# Patient Record
Sex: Female | Born: 1970
Health system: Southern US, Community
[De-identification: ages and names within clinical notes are randomized; demographics above are authoritative.]

## PROBLEM LIST (undated history)

## (undated) DIAGNOSIS — M069 Rheumatoid arthritis, unspecified: Secondary | ICD-10-CM

## (undated) DIAGNOSIS — R232 Flushing: Secondary | ICD-10-CM

## (undated) DIAGNOSIS — R102 Pelvic and perineal pain: Principal | ICD-10-CM

## (undated) DIAGNOSIS — E669 Obesity, unspecified: Secondary | ICD-10-CM

## (undated) DIAGNOSIS — M1990 Unspecified osteoarthritis, unspecified site: Secondary | ICD-10-CM

## (undated) DIAGNOSIS — I10 Essential (primary) hypertension: Secondary | ICD-10-CM

## (undated) DIAGNOSIS — F32A Depression, unspecified: Secondary | ICD-10-CM

## (undated) DIAGNOSIS — N926 Irregular menstruation, unspecified: Secondary | ICD-10-CM

## (undated) DIAGNOSIS — I2510 Atherosclerotic heart disease of native coronary artery without angina pectoris: Secondary | ICD-10-CM

## (undated) DIAGNOSIS — D219 Benign neoplasm of connective and other soft tissue, unspecified: Secondary | ICD-10-CM

## (undated) HISTORY — DX: Pelvic and perineal pain: R10.2

## (undated) HISTORY — DX: Depression, unspecified: F32.A

## (undated) HISTORY — DX: Rheumatoid arthritis, unspecified: M06.9

## (undated) HISTORY — DX: Flushing: R23.2

## (undated) HISTORY — DX: Benign neoplasm of connective and other soft tissue, unspecified: D21.9

## (undated) HISTORY — DX: Unspecified osteoarthritis, unspecified site: M19.90

## (undated) HISTORY — DX: Irregular menstruation, unspecified: N92.6

## (undated) HISTORY — DX: Atherosclerotic heart disease of native coronary artery without angina pectoris: I25.10

## (undated) HISTORY — DX: Essential (primary) hypertension: I10

## (undated) HISTORY — PX: CHOLECYSTECTOMY: SHX55

## (undated) HISTORY — DX: Obesity, unspecified: E66.9

## (undated) HISTORY — DX: Morbid (severe) obesity due to excess calories: E66.01

## (undated) HISTORY — PX: KNEE ARTHROSCOPY: SUR90

---

## 2000-08-07 ENCOUNTER — Emergency Department (HOSPITAL_COMMUNITY): Admission: EM | Admit: 2000-08-07 | Discharge: 2000-08-07 | Payer: Self-pay | Admitting: Internal Medicine

## 2000-08-07 ENCOUNTER — Encounter: Payer: Self-pay | Admitting: Internal Medicine

## 2001-04-10 ENCOUNTER — Encounter: Payer: Self-pay | Admitting: *Deleted

## 2001-04-10 ENCOUNTER — Emergency Department (HOSPITAL_COMMUNITY): Admission: EM | Admit: 2001-04-10 | Discharge: 2001-04-10 | Payer: Self-pay | Admitting: *Deleted

## 2001-05-15 ENCOUNTER — Encounter: Payer: Self-pay | Admitting: *Deleted

## 2001-05-15 ENCOUNTER — Emergency Department (HOSPITAL_COMMUNITY): Admission: EM | Admit: 2001-05-15 | Discharge: 2001-05-15 | Payer: Self-pay | Admitting: *Deleted

## 2001-07-24 ENCOUNTER — Ambulatory Visit (HOSPITAL_COMMUNITY): Admission: RE | Admit: 2001-07-24 | Discharge: 2001-07-24 | Payer: Self-pay | Admitting: Pulmonary Disease

## 2002-06-22 ENCOUNTER — Emergency Department (HOSPITAL_COMMUNITY): Admission: EM | Admit: 2002-06-22 | Discharge: 2002-06-22 | Payer: Self-pay | Admitting: *Deleted

## 2002-06-27 ENCOUNTER — Ambulatory Visit (HOSPITAL_COMMUNITY): Admission: RE | Admit: 2002-06-27 | Discharge: 2002-06-27 | Payer: Self-pay | Admitting: Pulmonary Disease

## 2002-06-30 ENCOUNTER — Encounter: Payer: Self-pay | Admitting: *Deleted

## 2002-06-30 ENCOUNTER — Emergency Department (HOSPITAL_COMMUNITY): Admission: EM | Admit: 2002-06-30 | Discharge: 2002-06-30 | Payer: Self-pay | Admitting: *Deleted

## 2003-01-16 ENCOUNTER — Ambulatory Visit (HOSPITAL_COMMUNITY): Admission: RE | Admit: 2003-01-16 | Discharge: 2003-01-16 | Payer: Self-pay | Admitting: Pulmonary Disease

## 2004-02-23 HISTORY — PX: CARDIAC CATHETERIZATION: SHX172

## 2004-08-17 ENCOUNTER — Observation Stay (HOSPITAL_COMMUNITY): Admission: EM | Admit: 2004-08-17 | Discharge: 2004-08-18 | Payer: Self-pay | Admitting: Emergency Medicine

## 2006-05-30 ENCOUNTER — Ambulatory Visit (HOSPITAL_COMMUNITY): Admission: RE | Admit: 2006-05-30 | Discharge: 2006-05-30 | Payer: Self-pay | Admitting: Pulmonary Disease

## 2006-10-07 ENCOUNTER — Ambulatory Visit (HOSPITAL_COMMUNITY): Admission: RE | Admit: 2006-10-07 | Discharge: 2006-10-07 | Payer: Self-pay | Admitting: Obstetrics & Gynecology

## 2006-11-03 ENCOUNTER — Ambulatory Visit (HOSPITAL_COMMUNITY): Admission: RE | Admit: 2006-11-03 | Discharge: 2006-11-03 | Payer: Self-pay | Admitting: Obstetrics & Gynecology

## 2008-05-10 ENCOUNTER — Ambulatory Visit (HOSPITAL_BASED_OUTPATIENT_CLINIC_OR_DEPARTMENT_OTHER): Admission: RE | Admit: 2008-05-10 | Discharge: 2008-05-10 | Payer: Self-pay | Admitting: Specialist

## 2009-12-22 ENCOUNTER — Ambulatory Visit: Payer: Self-pay | Admitting: Cardiology

## 2009-12-22 DIAGNOSIS — R079 Chest pain, unspecified: Secondary | ICD-10-CM | POA: Insufficient documentation

## 2009-12-22 DIAGNOSIS — R002 Palpitations: Secondary | ICD-10-CM | POA: Insufficient documentation

## 2009-12-22 DIAGNOSIS — I10 Essential (primary) hypertension: Secondary | ICD-10-CM | POA: Insufficient documentation

## 2009-12-22 DIAGNOSIS — R0602 Shortness of breath: Secondary | ICD-10-CM | POA: Insufficient documentation

## 2010-01-01 ENCOUNTER — Ambulatory Visit: Payer: Self-pay | Admitting: Cardiology

## 2010-01-01 ENCOUNTER — Encounter: Payer: Self-pay | Admitting: Cardiology

## 2010-01-01 ENCOUNTER — Encounter (HOSPITAL_COMMUNITY)
Admission: RE | Admit: 2010-01-01 | Discharge: 2010-01-31 | Payer: Self-pay | Source: Home / Self Care | Attending: Cardiology | Admitting: Cardiology

## 2010-01-14 ENCOUNTER — Ambulatory Visit: Payer: Self-pay | Admitting: Cardiology

## 2010-03-26 NOTE — Letter (Signed)
Summary: [facility] (Nuc Med Stress)  [facility]  [address], [place] [address]   Phone: [phone]  Fax: [phone]    Nuclear Medicine 1-Day Stress Test Information Sheet  Re:     Jaime Sanchez   DOB:     August 06, 1970 MRN:     [id number] Weight:  Appointment Date: Register at: Appointment Time: Referring MD:  ___Exercise Stress  __Adenosine   __Dobutamine  _X_Lexiscan  __Persantine   __Thallium  Urgency: ____1 (next day)   ____2 (one week)    ____3 (PRN)  Patient will receive Follow Up call with results: Patient needs follow-up appointment:  Instructions regarding medication:  How to prepare for your stress test: 1. DO NOT eat or drink after midnight the night before test. This includes no caffeine (coffee, tea, sodas, chocolate) if you were instructed to take your medications, drink water with it.  2. DO NOT use any tobacco products for at leaset 8 hours prior to arrival. 3. DO NOT wear dresses or any clothing that may have metal clasps or buttons. 4. Wear short sleeve shirts, loose clothing, and comfortalbe walking shoes. 5. DO NOT use lotions, oils or powder on your chest before the test. 6. The test will take approximately 3-4 hours from the time you arrive until completion. 7. To register the day of the test, go to the Short Stay entrance at [facility]. 8. If you must cancel your test, call [phone] as soon as you are aware.  After you arrive for test:   When you arrive at [facility], you will go to Short Stay to be registered. They will then send you to Radiology to check in. The Nuclear Medicine Tech will get you and start an IV in your arm or hand. A small amount of a radioactive tracer will then be injected into your IV. This tracer will then have to circulate for 30-45 minutes. During this time you will wait in the waiting room and you will be able to drink something without caffeine. A series of pictures will  be taken of your heart follwoing this waiting period. After the 1st set of pictures you will go to the stress lab to get ready for your stress test. During the stress test, another small amount of a radioactive tracer will be injected through your IV. When the stress test is complete, there is a short rest period while your heart rate and blood pressure will be monitored. When this monitoring period is complete you will have another set of pictrues taken. (The same as the 1st set of pictures). These pictures are taken between 15 minutes and 1 hour after the stress test. The time depends on the type of stress test you had. Your doctor will inform you of your test results within 7 days after test.    The possibilities of certain changes are possible during the test. They include abnormal blood pressure and disorders of the heart. Side effects of persantine or adenosine can include flushing, chest pain, shortness of breath, stomach tightness, headache and light-headedness. These side effects usually do not last long and are self-resolving. Every effort will be made to keep you comfortable and to minimize complications by obtaining a medical history and by close observation during the test. Emergency equipment, medications, and trained personnel are available to deal with any unusual situation which may arise.  Please notify office at least 48 hours in advance if you are unable to keep  this appt.

## 2010-03-26 NOTE — Assessment & Plan Note (Signed)
Summary: per Dr.Hawkins for chest pain and coronary disease/tg   Visit Type:  Initial Consult Primary Provider:  Dr. Kari Baars   History of Present Illness: 40 year old woman presents for cardiology consultation. She is a former Research officer, political party and Vascular patient. History is reviewed below. She reports a several week to month history of increasing episodes of chest pain. She describes a pressure sensation that is fairly sporadic during the daytime hours, often at rest, lasting 15-20 minutes. Not provoked by exertion. At nighttime she also states that she wakes up feeling short of breath, has to sit on the side of the bed. She is not aware if she has any snoring or apnea, as her husband uses CPAP and does not comment on her sleep habits.  In addition, she has occasional palpitations, usually described as a quick heartbeat that lasts for a few seconds. No associated dizziness or syncope.  Prior cardiovascular evaluation is reviewed including a cardiac catheterization from 2006 demonstrating only minor coronary atherosclerosis. She has an impressive family history of premature cardiovascular disease. Reports recent lipid profile by Dr. Juanetta Gosling. Also states her blood pressure has been recently elevated, with lisinopril added to her regimen over the last few weeks.  She has not undergone any interval cardiac evaluation since 2006.  Current Medications (verified): 1)  Metoprolol Tartrate 100 Mg Tabs (Metoprolol Tartrate) .... Take 1 Tab Two Times A Day 2)  Advil 200 Mg Tabs (Ibuprofen) .... As Needed 3)  Lisinopril 20 Mg Tabs (Lisinopril) .... 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  The patient denies anorexia, fever, weight loss, syncope, dyspnea on exertion, peripheral edema, prolonged cough, headaches, hemoptysis, melena, hematochezia, and severe indigestion/heartburn.         Otherwise reviewed and negative.  Vital Signs:  Patient profile:   40 year old female Height:      70 inches Weight:      336 pounds BMI:     48.39 Pulse rate:   75 / minute BP sitting:   128 / 75  (right arm)  Vitals Entered By: Dreama Saa, CNA (December 22, 2009 1:30 PM)  Physical Exam  Additional Exam:  Morbidly obese woman, 336 pounds, in no acute distress. HEENT: Conjunctiva and lids normal, oropharynx with moist mucosa. Neck: Supple, no elevated JVP or bruits. Lungs: Clear to auscultation, nonlabored. Cardiac: Regular rate and rhythm, no S3, PMI is indistinct however. Abdomen: Obese, unable to palpate liver edge, bowel sounds present, nontender. Skin: Warm and dry. Extremities: No pitting edema, some venous stasis, distal pulses one plus. Musculoskeletal: No gross deformities. Neuropsychiatric: Alert and oriented x3,  affect appropriate.   Cardiac Cath  Procedure date:  08/18/2004  Findings:      ANGIOGRAPHIC DATA:  1.  Common ostium which immediately bifurcated into an LAD and left      circumflex system.  2.  The LAD had smooth 10% ostial narrowing not significantly changed from      2003. The  remainder of the LAD was angiographically normal and gave rise      to two proximal diagonal vessels and the septal perforating artery.  3.  The circumflex vessel gave rise to one large bifurcating first marginal      vessel and two smaller second and third marginal vessels. The circumflex      and its branches were angiographically normal.  4.  The right coronary artery was a large-caliber dominant vessel which and      gave rise to a conus branch near its ostium and ended in the PDA and      posterolateral system. The right coronary artery and its branches were      normal.  5.  Biplane single left ventriculography revealed normal LV contractility      without focal segmental wall motion abnormalities.  EKG  Procedure date:  12/22/2009  Findings:      Sinus rhythm at 72 beats per minutes with small R prime in lead V1. Decreased anterior R-wave progression.  Impression & Recommendations:  Problem # 1:  CHEST PAIN (ICD-786.50)  Somewhat atypical in description in that it is sporadic, typically at rest. She does however describe a pressure like sensation. Active cardiac risk factors include morbid obesity, hypertension that  has been recently out of control, and family history of premature cardiovascular disease. She did undergo a cardiac catheterization nearly 6 years ago which demonstrated only mild atherosclerosis within the left anterior descending. She has had no followup cardiac testing since that time. We discussed this, and plan a followup 2-day Lexiscan Myoview on medical therapy. I would prefer not to hold her metoprolol since it is at such a high dose she is likely to have rebound tachycardia, and therefore need to avoid treadmill testing. Plan to bring her back to the office to discuss the results further.  The following medications were removed from the medication list:    Toprol Xl 200 Mg Xr24h-tab (Metoprolol succinate) .Marland Kitchen... Take 1 tab daily Her updated medication list for  this problem includes:    Metoprolol Tartrate 100 Mg Tabs (Metoprolol tartrate) .Marland Kitchen... Take 1 tab two times a day    Lisinopril 20 Mg Tabs (Lisinopril) .Marland Kitchen... Take 1 tab daily  Orders: 2-D Echocardiogram (2D Echo) Nuclear Stress Test (Nuc Stress Test)  Problem # 2:  SHORTNESS OF BREATH (ICD-786.05)  Mainly nocturnal as described, rule out orthopnea. She does not report any dramatic increase in breathlessness during the daytime however. I wonder if her symptoms could be a reflection of undiagnosed obstructive sleep apnea. Need to reassess left ventricular function however, and a 2-D echocardiogram will be arranged.  The following medications were removed from the medication list:    Toprol Xl 200 Mg Xr24h-tab (Metoprolol succinate) .Marland Kitchen... Take 1 tab daily Her updated medication list for this problem includes:    Metoprolol Tartrate 100 Mg Tabs (Metoprolol tartrate) .Marland Kitchen... Take 1 tab two times a day    Lisinopril 20 Mg Tabs (Lisinopril) .Marland Kitchen... Take 1 tab daily  Orders: 2-D Echocardiogram (2D Echo) Nuclear Stress Test (Nuc Stress Test)  Problem # 3:  HYPERTENSION (ICD-401.9)  Recently out  of control by report, lisinopril added. Blood pressure looks fairly good today.  The following medications were removed from the medication list:    Toprol Xl 200 Mg Xr24h-tab (Metoprolol succinate) .Marland Kitchen... Take 1 tab daily Her updated medication list for this problem includes:    Metoprolol Tartrate 100 Mg Tabs (Metoprolol tartrate) .Marland Kitchen... Take 1 tab two times a day    Lisinopril 20 Mg Tabs (Lisinopril) .Marland Kitchen... Take 1 tab daily  Orders: 2-D Echocardiogram (2D Echo) Nuclear Stress Test (Nuc Stress Test)  Problem # 4:  MORBID OBESITY (ICD-278.01)  We did discuss weight loss. She has thought about this already. She is actually considering bariatric surgery.  Problem # 5:  PALPITATIONS (ICD-785.1)  Most likely benign, without associated dizziness or syncope. Resting ECG is nonspecific. At this point we'll  hold off on further diagnostic testing, unless significant abnormalities are uncovered through both structural and ischemic evaluation.  The following medications were removed from the medication list:    Toprol Xl 200 Mg Xr24h-tab (Metoprolol succinate) .Marland Kitchen... Take 1 tab daily Her updated medication list for this problem includes:    Metoprolol Tartrate 100 Mg Tabs (Metoprolol tartrate) .Marland Kitchen... Take 1 tab two times a day    Lisinopril 20 Mg Tabs (Lisinopril) .Marland Kitchen... Take 1 tab daily  Patient Instructions: 1)  Your physician recommends that you schedule a follow-up appointment in: 3 weeks 2)  Your physician recommends that you continue on your current medications as directed. Please refer to the Current Medication list given to you today. 3)  Your physician has requested that you have an echocardiogram.  Echocardiography is a painless test that uses sound waves to create images of your heart. It provides your doctor with information about the size and shape of your heart and how well your heart's chambers and valves are working.  This procedure takes approximately one hour. There are no restrictions for this procedure. 4)  Your physician has requested that you have an [employer]  For further information please visit [email or web address]  Please follow instruction sheet, as given.

## 2010-03-26 NOTE — Assessment & Plan Note (Signed)
Summary: 3 wk f/u per checkout on 12/22/09/tg   Visit Type:  Follow-up Primary Provider:  Dr. Kari Baars   History of Present Illness: 40 year old woman presents for followup. She was seen back in October with history of somewhat atypical chest discomfort and dyspnea on exertion, although in the setting of cardiac risk factors. She was referred for a Myoview and echocardiogram, outlined below.  Today we discussed her testing, overall reassuring without definitive ischemia or evidence of left ventricular dysfunction. She states symptomatically she has actually had less chest discomfort, and stable dyspnea on exertion which he attributes mainly to her weight. She is trying to diet, and even considering weight reduction surgery.  We discussed other alternatives for cardiac assessment, in the event her symptoms progress, although at this point observation with plan for diet, weight loss, exercise was adopted.  Current Medications (verified): 1)  Metoprolol Tartrate 100 Mg Tabs (Metoprolol Tartrate) .... Take 1 Tab Two Times A Day 2)  Advil 200 Mg Tabs (Ibuprofen) .... As Needed 3)  Lisinopril 20 Mg Tabs (Lisinopril) .... Take 1 Tab Daily 4)  Mobic 15 Mg Tabs (Meloxicam) .... 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         Otherwise reviewed and negative.  Vital Signs:  Patient profile:   40 year old female Weight:      336 pounds O2 Sat:      99 % on Room air Pulse rate:   76 / minute BP sitting:   122 / 77  (left  arm)  Vitals Entered ByLarita Fife Via LPN (January 14, 2010 2:37 PM)  O2 Flow:  Room air  Physical Exam  Additional Exam:  Morbidly obese woman, 336 pounds, in no acute distress. HEENT: Conjunctiva and lids normal, oropharynx with moist mucosa. Neck: Supple, no elevated JVP or bruits. Lungs: Clear to auscultation, nonlabored. Cardiac: Regular rate and rhythm, no S3, PMI is indistinct however. Abdomen: Obese, unable to palpate liver edge, bowel sounds present, nontender. Skin: Warm and dry. Extremities: No pitting edema, some venous stasis, distal pulses one plus. Musculoskeletal: No gross deformities. Neuropsychiatric: Alert and oriented x3, affect appropriate.   Nuclear Study  Procedure date:  01/01/2010  Findings:      Scintigraphic Data: Analysis of the raw perfusion data finds evidence of breast attenuation.   Tomographic views were obtained using the short axis, vertical long axis, and horizontal long axis planes.  There is a mild to moderate intensity anteroseptal defect that is mainly fixed with a small area of partial reversibility at the very apex.  This likely is indicative of variable soft tissue attenuation rather than minor degree of ischemia.   Gated imaging reveals an EDV of 110, ESV of 42, T I D ratio of 0.9, and LVEF of 61% without wall motion abnormalities.   IMPRESSION: Low risk Lexiscan Myoview as outlined.  There were no diagnostic ST- segment changes.  Breast attenuation artifact is noted with a mild to moderate intensity anteroseptal defect that is mainly fixed with a small degree of partial reversibility at the apex.  This most likely  is indicative of variable soft tissue attenuation rather than a minor degree of ischemia.  LVEF is normal 61%.  Echocardiogram  Procedure date:  01/01/2010  Findings:       Study Conclusions    - Left ventricle: The cavity size was normal. Wall thickness was     increased in a pattern of mild LVH. Systolic  function was normal.     The estimated ejection fraction was in the range of 55% to 60%.     Wall motion was normal; there were no regional wall motion     abnormalities. The study is not technically sufficient to allow     evaluation of LV diastolic function.   - Mitral valve: Trivial regurgitation.   - Tricuspid valve: Trivial regurgitation.   - Pericardium, extracardiac: There was no pericardial effusion.  Impression & Recommendations:  Problem # 1:  CHEST PAIN (ICD-786.50)  Improved, although not entirely resolved. Largely atypical in description, and recent Myoview does not show definitive ischemia, most likely variable soft tissue attenuation, with LVEF of 61%. Risk factor modification strategies were recommended including diet, exercise, and weight loss. Symptom observation with followup with Dr. Juanetta Gosling. We can see her back as needed, and certainly consider further testing if symptoms progress.  Her updated medication list for this problem includes:    Metoprolol Tartrate 100 Mg Tabs (Metoprolol tartrate) .Marland Kitchen... Take 1 tab two times a day    Lisinopril 20 Mg Tabs (Lisinopril) .Marland Kitchen... Take 1 tab daily  Problem # 2:  MORBID OBESITY (ICD-278.01)  She is strongly considering weight reduction surgery.  Problem # 3:  HYPERTENSION (ICD-401.9)  Blood pressure well controlled today.  Her updated medication list for this problem includes:    Metoprolol Tartrate 100 Mg Tabs (Metoprolol tartrate) .Marland Kitchen... Take 1 tab two times a day    Lisinopril 20 Mg Tabs (Lisinopril) .Marland Kitchen... Take 1 tab daily  Patient Instructions: 1)  Continue followup with Dr. Juanetta Gosling. We can see her back as needed.  Prevention & Chronic Care Immunizations   Influenza vaccine: Not documented    Tetanus booster: Not documented    Pneumococcal vaccine: Not documented  Other Screening   Pap smear: Not documented    Mammogram: Not documented   Smoking status: never  (12/11/2009)  Lipids   Total Cholesterol: Not  documented   LDL: Not documented   LDL Direct: Not documented   HDL: Not documented   Triglycerides: Not documented  Hypertension   Last Blood Pressure: 122 / 77  (01/14/2010)   Serum creatinine: Not documented   Serum potassium Not documented  Self-Management Support :    Hypertension self-management support: Not documented

## 2010-04-19 ENCOUNTER — Emergency Department (INDEPENDENT_AMBULATORY_CARE_PROVIDER_SITE_OTHER): Payer: BC Managed Care – PPO

## 2010-04-19 ENCOUNTER — Emergency Department (HOSPITAL_BASED_OUTPATIENT_CLINIC_OR_DEPARTMENT_OTHER)
Admission: EM | Admit: 2010-04-19 | Discharge: 2010-04-19 | Disposition: A | Discharge status: Home / Self Care | Payer: BC Managed Care – PPO | Attending: Emergency Medicine | Admitting: Emergency Medicine

## 2010-04-19 DIAGNOSIS — I2510 Atherosclerotic heart disease of native coronary artery without angina pectoris: Secondary | ICD-10-CM | POA: Insufficient documentation

## 2010-04-19 DIAGNOSIS — R05 Cough: Secondary | ICD-10-CM

## 2010-04-19 DIAGNOSIS — R0989 Other specified symptoms and signs involving the circulatory and respiratory systems: Secondary | ICD-10-CM | POA: Insufficient documentation

## 2010-04-19 DIAGNOSIS — R509 Fever, unspecified: Secondary | ICD-10-CM

## 2010-04-19 DIAGNOSIS — J40 Bronchitis, not specified as acute or chronic: Secondary | ICD-10-CM | POA: Insufficient documentation

## 2010-04-19 DIAGNOSIS — R0609 Other forms of dyspnea: Secondary | ICD-10-CM | POA: Insufficient documentation

## 2010-04-19 DIAGNOSIS — R059 Cough, unspecified: Secondary | ICD-10-CM

## 2010-04-19 DIAGNOSIS — I10 Essential (primary) hypertension: Secondary | ICD-10-CM | POA: Insufficient documentation

## 2010-04-19 DIAGNOSIS — J3489 Other specified disorders of nose and nasal sinuses: Secondary | ICD-10-CM | POA: Insufficient documentation

## 2010-06-04 LAB — POCT I-STAT, CHEM 8
BUN: 9 mg/dL (ref 6–23)
Calcium, Ion: 1.19 mmol/L (ref 1.12–1.32)
Chloride: 101 mEq/L (ref 96–112)
Glucose, Bld: 92 mg/dL (ref 70–99)
HCT: 39 % (ref 36.0–46.0)
Potassium: 3.8 mEq/L (ref 3.5–5.1)

## 2010-06-04 LAB — POCT PREGNANCY, URINE: Preg Test, Ur: NEGATIVE

## 2010-07-07 NOTE — Op Note (Signed)
Jaime Sanchez, KLOSE          ACCOUNT NO.:  [id number]   MEDICAL RECORD NO.:  [id number]          PATIENT TYPE:  AMB   LOCATION:  [facility]                         FACILITY:  [facility]   PHYSICIAN:  Jene Every, M.D.    DATE OF BIRTH:  April 28, 1970   DATE OF PROCEDURE:  05/10/2008  DATE OF DISCHARGE:                               OPERATIVE REPORT   PREOPERATIVE DIAGNOSES:  Degenerative joint disease, right knee.   POSTOPERATIVE DIAGNOSES:  Grade 4 chondromalacia, femoral sulcus.  Grade 3 large lesion, medial femoral condyle patella.   PROCEDURES PERFORMED:  1. Right-knee arthroscopy.  2. Chondroplasty, medial femoral condyle, excision of chondral flap      tears of medial femoral condyle and of the sulcus.  3. Chondroplasty of patella.   BRIEF HISTORY:  A 40 year old with knee-pain, refractory to conservative  treatment, indicated for arthroscopic debridement.  Risks and benefits  were discussed, including bleeding, infection, no change in symptoms,  worsening of symptoms, need for repeat debridement, etc.   TECHNIQUE:  Patient in supine position.  After the induction of adequate  general anesthesia and 2 g Kefzol, the right lower extremity was prepped  and draped in the usual sterile fashion.  A lateral parapatellar and  superomedial parapatellar portal was fashioned with a #11-blade, ingress  cannula atraumatically placed.  Irrigant was utilized to insufflate the  joint.  Under direct visualization, a medial parapatellar portal was  fashioned with a #11-blade, after localization with an 18-gauge needle,  sparing the medial meniscus.   Noted immediately was extensive grade III lesion of the medial femoral  condyle with loose cartilaginous debris.  The shaver was introduced to  perform a light chondroplasty to a stable base, further contoured with a  straight basket.  Degenerative fraying of the meniscus was noted.  This  was debrided.  The remainder of the femoral condyle and  tibial plateau  was unremarkable.  There was a fairly large lesion in the weightbearing  surface, measuring approximately 2 x 4 cm.   The ACL and PCL were normal.   Lateral compartment revealed normal femoral condyle, tibial plateau and  meniscus to probe and palpation without evidence of tearing.   Examination of the patellofemoral joint revealed extensive grade III  changes of the patella.  Chondroplasty was performed here.  Examination  of sulcus revealed extensive grade IV changes with chondral flap tears  at the periphery.  These were excised with straight basket and contoured  to a stable base with a 0.35 curved shaver.   Using copious lavage, there was normal patellofemoral tracking.  Gutters  were unremarkable.  We re-examined all three compartments.  No residual  pathology.  Minimal surgical intervention.   Next, all instrumentation was removed.  Portals were closed with 4-0  nylon simple sutures.  Marcaine 0.25% with epinephrine was infiltrated  into the joint.  Wound was dressed sterilely.  Awakened without  difficulty and transported to the recovery room in satisfactory  condition.   The patient tolerated the procedure well, no complications.      Jene Every, M.D.  Electronically Signed     JB/MEDQ  D:  05/10/2008  T:  05/10/2008  Job:  [id number]

## 2010-07-10 NOTE — Discharge Summary (Signed)
[name], [name]          ACCOUNT NO.:  [id number]   MEDICAL RECORD NO.:  [id number]          PATIENT TYPE:  INP   LOCATION:  2031                         FACILITY:  [facility]   PHYSICIAN:  Ilene Qua, M.D.    DATE OF BIRTH:  Dec 19, 1970   DATE OF ADMISSION:  08/17/2004  DATE OF DISCHARGE:  08/18/2004                           DISCHARGE SUMMARY - REFERRING   Jaime Sanchez is a 40 year old female patient who has a history of cardiac  catheterization in 2003 with 10 to 20% ostial stenosis a the LAD.  Her EF  was 65%.  She has been doing fairly well.  However, recently she has  developed some chest pressure.  It has been on and off for about a month.  The day of admission, she also had nausea with it.  She had no shortness of  breath or dizziness or lightheadedness.  She was admitted to rule out MI.  She was given IV Toradol.  Recently, she had had a medication change from  Toprol XL 200 mg everyday to Lopressor 100 mg b.i.d.  It was decided to  change her back.  She was seen by Dr. Tresa Sanchez.  She was seen the following day  by Dr. Tresa Sanchez.  Her CK and MBs were negative.  It was decided that she should  undergo cardiac catheterization.  This was performed by Dr. Tresa Sanchez.  She had  essentially normal coronaries.  Normal LV function.  She tolerated procedure  well.  He thought she could be discharge later in the day if her vital signs  are stable and her groin is stable after she gets up and walks.  He  recommended to her that she should concentrate on weight loss.  She can use  Aleve for her chest discomfort.  She is to stay on Toprol XL 200 mg every  day.   LABORATORY DATA:  Urine pregnancy was negative.  AST was 17, ALT was 19.  Albumin was 3.7.  ESR was 43.  Lipase was 23.  CK-MBs and troponins were  negative.  Amylase was 57.  D-dimer was less than 0.22.  Sodium 138,  potassium 3.4, chloride 104, glucose 97, BUN 9, creatinine 0.7.  BNP was  123.   She did have a chest CT that showed  no pulmonary embolus.  Her regular chest  x-ray showed low volume scan, no acute infiltrate.   DISCHARGE INSTRUCTIONS:  She will continue her Toprol XL 200 mg one time per  day.  She may take Aleve for chest discomfort.  We will put her on Nexium 40  mg one time a day and she will follow up with Dr. Domingo Sep on August 19, 2004,  at 11:30.   DISCHARGE DIAGNOSES:  1.  Chest pain not ischemic related, status post cath was essentially normal      to minimal coronary artery disease.  2.  Hypertension.  3.  Morbid obesity.  4.  Unknown cholesterol.     Be  BB/MEDQ  D:  08/18/2004  T:  08/18/2004  Job:  [id number]   cc:   Rollene Rotunda, M.D.

## 2010-07-10 NOTE — Cardiovascular Report (Signed)
NAMEBAYA, LENTZ NO.:  [id number]   MEDICAL RECORD NO.:  [id number]          PATIENT TYPE:  INP   LOCATION:  2031                         FACILITY:  [facility]   PHYSICIAN:  Nicki Guadalajara, M.D.     DATE OF BIRTH:  09-18-1970   DATE OF PROCEDURE:  08/18/2004  DATE OF DISCHARGE:                              CARDIAC CATHETERIZATION   INDICATIONS:  Ms. Jaime Sanchez is a 40 year old morbidly obese female  who previously had undergone prior cardiac catheterization in 2003 which  showed ostial 10-20% LAD narrowing. Recently, she has noticed progressive  development of chest pain and shortness of breath with activity. She was  admitted to [facility] yesterday with chest pain. CPK enzymes and  troponin's were negative. The patient has experienced chest pain with  walking which is then followed by shortness of breath. She is morbidly obese  and has significant reduction in aerobic capacity. Due to her large size  (weight 334 pounds), it was felt that radionuclear imaging would not yield  accurate assessment due to potential for attenuation artifacts. In light of  her increasing symptomatology, definitive repeat catheterization was  recommended.   PROCEDURE IN DETAIL:  After premedication with Versed initially 2 milligrams  and ultimately an additional 2 milligrams during the procedure, the patient  was prepped and draped in the usual fashion. Her right femoral artery was  punctured anteriorly and a 5-French sheath was inserted. Diagnostic  catheterization was done with a 5-French Judkins-4 left coronary catheter.  Ultimately, a 5-French no torque right catheter was necessary for selective  angiography in the right coronary artery due to the JR-4 catheter  essentially cannulating only the conus branch. Pigtail catheter was used for  biplane single left ventriculography. The patient tolerated the procedure  well. She returned to her room in satisfactory  condition with plans for  hospital discharged later today.   HEMODYNAMIC DATA:  Central aortic pressure is 125/72, mean 96. Left  ventricle pressure is 125/5, post A-wave 12.   ANGIOGRAPHIC DATA:  1.  Common ostium which immediately bifurcated into an LAD and left      circumflex system.  2.  The LAD had smooth 10% ostial narrowing not significantly changed from      2003. The remainder of the LAD was angiographically normal and gave rise      to two proximal diagonal vessels and the septal perforating artery.  3.  The circumflex vessel gave rise to one large bifurcating first marginal      vessel and two smaller second and third marginal vessels. The circumflex      and its branches were angiographically normal.  4.  The right coronary artery was a large-caliber dominant vessel which and      gave rise to a conus branch near its ostium and ended in the PDA and      posterolateral system. The right coronary artery and its branches were      normal.  5.  Biplane single left ventriculography revealed normal LV contractility      without focal segmental wall motion abnormalities.   IMPRESSION:  1.  Essentially normal coronary arteries with smooth 10% ostial narrowing of      the left anterior descending artery,      unchanged from prior study.  2.  Normal circumflex.  3.  Normal dominant right coronary artery.       TK/MEDQ  D:  08/18/2004  T:  08/18/2004  Job:  [id number]   cc:   Dani Gobble, MD  Fax: [phone]   Oneal Deputy. Juanetta Gosling, M.D.  [address]  [place]  [place] [address]  Fax: [phone]

## 2010-12-04 LAB — LUPUS ANTICOAGULANT PANEL
DRVVT: 40.6 (ref 36.1–47.0)
PTT Lupus Anticoagulant: 48.2 (ref 36.3–48.8)

## 2010-12-04 LAB — ANTITHROMBIN III: AntiThromb III Func: 112 (ref 76–126)

## 2010-12-04 LAB — CARDIOLIPIN ANTIBODIES, IGG, IGM, IGA
Anticardiolipin IgA: 8 — ABNORMAL LOW (ref <13)
Anticardiolipin IgM: <7 — ABNORMAL LOW (ref <10)

## 2011-02-18 ENCOUNTER — Encounter: Payer: Self-pay | Admitting: Cardiology

## 2011-11-16 ENCOUNTER — Ambulatory Visit (HOSPITAL_COMMUNITY)
Admission: RE | Admit: 2011-11-16 | Discharge: 2011-11-16 | Disposition: A | Discharge status: Home / Self Care | Payer: 59 | Source: Ambulatory Visit | Attending: Pulmonary Disease | Admitting: Pulmonary Disease

## 2011-11-16 ENCOUNTER — Other Ambulatory Visit (HOSPITAL_COMMUNITY): Payer: Self-pay | Admitting: Pulmonary Disease

## 2011-11-16 DIAGNOSIS — M7989 Other specified soft tissue disorders: Secondary | ICD-10-CM

## 2011-11-16 DIAGNOSIS — M79673 Pain in unspecified foot: Secondary | ICD-10-CM

## 2011-11-16 DIAGNOSIS — M25579 Pain in unspecified ankle and joints of unspecified foot: Secondary | ICD-10-CM

## 2011-11-16 DIAGNOSIS — M79609 Pain in unspecified limb: Secondary | ICD-10-CM | POA: Insufficient documentation

## 2011-11-29 ENCOUNTER — Other Ambulatory Visit (HOSPITAL_COMMUNITY): Payer: Self-pay | Admitting: Pulmonary Disease

## 2011-11-29 ENCOUNTER — Ambulatory Visit (HOSPITAL_COMMUNITY)
Admission: RE | Admit: 2011-11-29 | Discharge: 2011-11-29 | Disposition: A | Discharge status: Home / Self Care | Payer: 59 | Source: Ambulatory Visit | Attending: Pulmonary Disease | Admitting: Pulmonary Disease

## 2011-11-29 DIAGNOSIS — M79643 Pain in unspecified hand: Secondary | ICD-10-CM

## 2011-11-29 DIAGNOSIS — M25549 Pain in joints of unspecified hand: Secondary | ICD-10-CM | POA: Insufficient documentation

## 2012-03-09 ENCOUNTER — Other Ambulatory Visit (HOSPITAL_COMMUNITY): Payer: Self-pay | Admitting: Pulmonary Disease

## 2012-03-09 DIAGNOSIS — Z139 Encounter for screening, unspecified: Secondary | ICD-10-CM

## 2012-04-07 ENCOUNTER — Ambulatory Visit (HOSPITAL_COMMUNITY): Payer: 59

## 2012-04-17 ENCOUNTER — Ambulatory Visit (HOSPITAL_COMMUNITY)
Admission: RE | Admit: 2012-04-17 | Discharge: 2012-04-17 | Disposition: A | Discharge status: Home / Self Care | Payer: 59 | Source: Ambulatory Visit | Attending: Pulmonary Disease | Admitting: Pulmonary Disease

## 2012-04-17 DIAGNOSIS — Z139 Encounter for screening, unspecified: Secondary | ICD-10-CM

## 2012-04-17 DIAGNOSIS — Z1231 Encounter for screening mammogram for malignant neoplasm of breast: Secondary | ICD-10-CM | POA: Insufficient documentation

## 2012-07-18 ENCOUNTER — Encounter: Payer: Self-pay | Admitting: Adult Health

## 2012-07-18 ENCOUNTER — Ambulatory Visit (INDEPENDENT_AMBULATORY_CARE_PROVIDER_SITE_OTHER): Payer: 59 | Admitting: Adult Health

## 2012-07-18 ENCOUNTER — Other Ambulatory Visit (HOSPITAL_COMMUNITY)
Admission: RE | Admit: 2012-07-18 | Discharge: 2012-07-18 | Disposition: A | Discharge status: Home / Self Care | Payer: 59 | Source: Ambulatory Visit | Attending: Adult Health | Admitting: Adult Health

## 2012-07-18 VITALS — BP 120/76 | HR 78 | Ht 70.0 in | Wt 354.0 lb

## 2012-07-18 DIAGNOSIS — M069 Rheumatoid arthritis, unspecified: Secondary | ICD-10-CM | POA: Insufficient documentation

## 2012-07-18 DIAGNOSIS — Z1212 Encounter for screening for malignant neoplasm of rectum: Secondary | ICD-10-CM

## 2012-07-18 DIAGNOSIS — Z01419 Encounter for gynecological examination (general) (routine) without abnormal findings: Secondary | ICD-10-CM

## 2012-07-18 DIAGNOSIS — N926 Irregular menstruation, unspecified: Secondary | ICD-10-CM

## 2012-07-18 DIAGNOSIS — Z1151 Encounter for screening for human papillomavirus (HPV): Secondary | ICD-10-CM | POA: Insufficient documentation

## 2012-07-18 DIAGNOSIS — N949 Unspecified condition associated with female genital organs and menstrual cycle: Secondary | ICD-10-CM

## 2012-07-18 HISTORY — DX: Irregular menstruation, unspecified: N92.6

## 2012-07-18 HISTORY — DX: Rheumatoid arthritis, unspecified: M06.9

## 2012-07-18 LAB — HEMOCCULT GUIAC POC 1CARD (OFFICE)

## 2012-07-18 NOTE — Progress Notes (Signed)
Patient ID: Jaime Sanchez, female   DOB: 10-25-1970, 42 y.o.   MRN: [id number] History of Present Illness: Jaime Sanchez is a 42 year old white female, married, in for a pap and physical. She has not had a pap in years. She is having some irregular menses, she may skip 3-4 months and has some ovarian pain.She has not used birth control in years and has 68 year old son, Jaime Sanchez.She works at [employer]  Current Medications, Allergies, Past Medical History, Past Surgical History, Family History and Social History were reviewed in [employer] record.    Review of Systems: Patient denies any headaches, blurred vision, shortness of breath, chest pain, abdominal pain, problems with bowel movements, urination, or intercourse. Positives as above, and left breast hurts if she gets cold, had normal mammogram in 2/14.She RA so she has pains at times. No mood changes.   Physical Exam:Blood pressure 120/76, pulse 78, height 5\' 10"  (1.778 m), weight 354 lb (160.573 kg), last menstrual period 06/30/2012. General:  Well developed, well nourished, no acute distress Skin:  Warm and dry and tan Neck:  Midline trachea, normal thyroid Lungs; Clear to auscultation bilaterally Breast:  No dominant palpable mass, retraction, or nipple discharge Cardiovascular: Regular rate and rhythm Abdomen:  Soft, non tender, no hepatosplenomegaly and obese, with some folliculitis areas Pelvic:  External genitalia is normal in appearance.  The vagina is normal in appearance.  The cervix is bulbous.Pap with HPV performed.  Uterus is felt to be normal size, shape, and contour.  No adnexal masses, she is tender over ovary area esp the right. Rectal: Good sphincter tone, no polyps, or hemorrhoids felt.  Hemoccult negative. Extremities:  No swelling or varicosities noted Psych:  Alert and cooperative, seems happy  Impression: Yearly exam History RA Ovarian pain  Irregular menses  Plan: Return  in 3 weeks for Pelvic US  to assess ovaries and see me Labs at PCP Mammogram yearly  Physical in 1 year

## 2012-07-18 NOTE — Patient Instructions (Addendum)
Follow up 3 weeks for [place] and see me  Mammogram yearly  Physical in 1 year

## 2012-08-08 ENCOUNTER — Other Ambulatory Visit: Payer: 59

## 2012-08-08 ENCOUNTER — Ambulatory Visit: Payer: 59 | Admitting: Adult Health

## 2012-08-14 ENCOUNTER — Ambulatory Visit: Payer: 59 | Admitting: Adult Health

## 2012-08-21 ENCOUNTER — Other Ambulatory Visit: Payer: 59

## 2012-08-21 ENCOUNTER — Encounter: Payer: Self-pay | Admitting: *Deleted

## 2012-08-21 ENCOUNTER — Ambulatory Visit: Payer: 59 | Admitting: Adult Health

## 2013-11-22 ENCOUNTER — Telehealth: Payer: Self-pay | Admitting: Adult Health

## 2013-11-22 NOTE — Telephone Encounter (Signed)
complains of pain in ovaries,was supposed to get [place] and did not come back for it in June, to call in am and schedule [place] and see me

## 2013-11-29 ENCOUNTER — Other Ambulatory Visit: Payer: Self-pay | Admitting: Adult Health

## 2013-11-29 DIAGNOSIS — R102 Pelvic and perineal pain: Secondary | ICD-10-CM

## 2013-12-03 ENCOUNTER — Ambulatory Visit (INDEPENDENT_AMBULATORY_CARE_PROVIDER_SITE_OTHER): Payer: 59

## 2013-12-03 ENCOUNTER — Encounter: Payer: Self-pay | Admitting: Adult Health

## 2013-12-03 ENCOUNTER — Ambulatory Visit (INDEPENDENT_AMBULATORY_CARE_PROVIDER_SITE_OTHER): Payer: 59 | Admitting: Adult Health

## 2013-12-03 VITALS — BP 126/80 | Ht 69.0 in | Wt 371.2 lb

## 2013-12-03 DIAGNOSIS — D219 Benign neoplasm of connective and other soft tissue, unspecified: Secondary | ICD-10-CM | POA: Insufficient documentation

## 2013-12-03 DIAGNOSIS — R102 Pelvic and perineal pain unspecified side: Secondary | ICD-10-CM | POA: Insufficient documentation

## 2013-12-03 DIAGNOSIS — E669 Obesity, unspecified: Secondary | ICD-10-CM

## 2013-12-03 DIAGNOSIS — R232 Flushing: Secondary | ICD-10-CM

## 2013-12-03 DIAGNOSIS — N951 Menopausal and female climacteric states: Secondary | ICD-10-CM

## 2013-12-03 DIAGNOSIS — D259 Leiomyoma of uterus, unspecified: Secondary | ICD-10-CM

## 2013-12-03 DIAGNOSIS — N926 Irregular menstruation, unspecified: Secondary | ICD-10-CM

## 2013-12-03 HISTORY — DX: Pelvic and perineal pain: R10.2

## 2013-12-03 HISTORY — DX: Benign neoplasm of connective and other soft tissue, unspecified: D21.9

## 2013-12-03 HISTORY — DX: Obesity, unspecified: E66.9

## 2013-12-03 HISTORY — DX: Flushing: R23.2

## 2013-12-03 LAB — TSH: TSH: 2.344 u[IU]/mL (ref 0.350–4.500)

## 2013-12-03 NOTE — Patient Instructions (Signed)
Perimenopause Perimenopause is the time when your body begins to move into the menopause (no menstrual period for 12 straight months). It is a natural process. Perimenopause can begin 2-8 years before the menopause and usually lasts for 1 year after the menopause. During this time, your ovaries may or may not produce an egg. The ovaries vary in their production of estrogen and progesterone hormones each month. This can cause irregular menstrual periods, difficulty getting pregnant, vaginal bleeding between periods, and uncomfortable symptoms. CAUSES Irregular production of the ovarian hormones, estrogen and progesterone, and not ovulating every month. Other causes include: Tumor of the pituitary gland in the brain. Medical disease that affects the ovaries. Radiation treatment. Chemotherapy. Unknown causes. Heavy smoking and excessive alcohol intake can bring on perimenopause sooner. SIGNS AND SYMPTOMS  Hot flashes. Night sweats. Irregular menstrual periods. Decreased sex drive. Vaginal dryness. Headaches. Mood swings. Depression. Memory problems. Irritability. Tiredness. Weight gain. Trouble getting pregnant. The beginning of losing bone cells (osteoporosis). The beginning of hardening of the arteries (atherosclerosis). DIAGNOSIS  Your health care provider will make a diagnosis by analyzing your age, menstrual history, and symptoms. He or she will do a physical exam and note any changes in your body, especially your female organs. Female hormone tests may or may not be helpful depending on the amount of female hormones you produce and when you produce them. However, other hormone tests may be helpful to rule out other problems. TREATMENT  In some cases, no treatment is needed. The decision on whether treatment is necessary during the perimenopause should be made by you and your health care provider based on how the symptoms are affecting you and your lifestyle. Various treatments are  available, such as: Treating individual symptoms with a specific medicine for that symptom. Herbal medicines that can help specific symptoms. Counseling. Group therapy. HOME CARE INSTRUCTIONS  Keep track of your menstrual periods (when they occur, how heavy they are, how long between periods, and how long they last) as well as your symptoms and when they started. Only take over-the-counter or prescription medicines as directed by your health care provider. Sleep and rest. Exercise. Eat a diet that contains calcium (good for your bones) and soy (acts like the estrogen hormone). Do not smoke. Avoid alcoholic beverages. Take vitamin supplements as recommended by your health care provider. Taking vitamin E may help in certain cases. Take calcium and vitamin D supplements to help prevent bone loss. Group therapy is sometimes helpful. Acupuncture may help in some cases. SEEK MEDICAL CARE IF:  You have questions about any symptoms you are having. You need a referral to a specialist (gynecologist, psychiatrist, or psychologist). SEEK IMMEDIATE MEDICAL CARE IF:  You have vaginal bleeding. Your period lasts longer than 8 days. Your periods are recurring sooner than 21 days. You have bleeding after intercourse. You have severe depression. You have pain when you urinate. You have severe headaches. You have vision problems. Document Released: 03/18/2004 Document Revised: 11/29/2012 Document Reviewed: 09/07/2012 [facility] Patient Information 2015 [place], [place]. This information is not intended to replace advice given to you by your health care provider. Make sure you discuss any questions you have with your health care provider. Fibroids Fibroids are lumps (tumors) that can occur any place in a woman's body. These lumps are not cancerous. Fibroids vary in size, weight, and where they grow. HOME CARE  Do not take aspirin.  Write down the number of pads or tampons you use during your period.  Tell your doctor.  This can help determine the best treatment for you. GET HELP RIGHT AWAY IF:  You have pain in your lower belly (abdomen) that is not helped with medicine.  You have cramps that are not helped with medicine.  You have more bleeding between or during your period.  You feel lightheaded or pass out (faint).  Your lower belly pain gets worse. MAKE SURE YOU:  Understand these instructions.  Will watch your condition.  Will get help right away if you are not doing well or get worse. Document Released: 03/13/2010 Document Revised: 05/03/2011 Document Reviewed: 03/13/2010 [facility] Patient Information 2015 [place], [place]. This information is not intended to replace advice given to you by your health care provider. Make sure you discuss any questions you have with your health care provider. Pelvic Pain Female pelvic pain can be caused by many different things and start from a variety of places. Pelvic pain refers to pain that is located in the lower half of the abdomen and between your hips. The pain may occur over a short period of time (acute) or may be reoccurring (chronic). The cause of pelvic pain may be related to disorders affecting the female reproductive organs (gynecologic), but it may also be related to the bladder, kidney stones, an intestinal complication, or muscle or skeletal problems. Getting help right away for pelvic pain is important, especially if there has been severe, sharp, or a sudden onset of unusual pain. It is also important to get help right away because some types of pelvic pain can be life threatening.  CAUSES  Below are only some of the causes of pelvic pain. The causes of pelvic pain can be in one of several categories.   Gynecologic.  Pelvic inflammatory disease.  Sexually transmitted infection.  Ovarian cyst or a twisted ovarian ligament (ovarian torsion).  Uterine lining that grows outside the uterus (endometriosis).  Fibroids, cysts, or  tumors.  Ovulation.  Pregnancy.  Pregnancy that occurs outside the uterus (ectopic pregnancy).  Miscarriage.  Labor.  Abruption of the placenta or ruptured uterus.  Infection.  Uterine infection (endometritis).  Bladder infection.  Diverticulitis.  Miscarriage related to a uterine infection (septic abortion).  Bladder.  Inflammation of the bladder (cystitis).  Kidney stone(s).  Gastrointestinal.  Constipation.  Diverticulitis.  Neurologic.  Trauma.  Feeling pelvic pain because of mental or emotional causes (psychosomatic).  Cancers of the bowel or pelvis. EVALUATION  Your caregiver will want to take a careful history of your concerns. This includes recent changes in your health, a careful gynecologic history of your periods (menses), and a sexual history. Obtaining your family history and medical history is also important. Your caregiver may suggest a pelvic exam. A pelvic exam will help identify the location and severity of the pain. It also helps in the evaluation of which organ system may be involved. In order to identify the cause of the pelvic pain and be properly treated, your caregiver may order tests. These tests may include:   A pregnancy test.  Pelvic ultrasonography.  An X-ray exam of the abdomen.  A urinalysis or evaluation of vaginal discharge.  Blood tests. HOME CARE INSTRUCTIONS   Only take over-the-counter or prescription medicines for pain, discomfort, or fever as directed by your caregiver.   Rest as directed by your caregiver.   Eat a balanced diet.   Drink enough fluids to make your urine clear or pale yellow, or as directed.   Avoid sexual intercourse if it causes pain.   Apply warm  or cold compresses to the lower abdomen depending on which one helps the pain.   Avoid stressful situations.   Keep a journal of your pelvic pain. Write down when it started, where the pain is located, and if there are things that seem to  be associated with the pain, such as food or your menstrual cycle.  Follow up with your caregiver as directed.  SEEK MEDICAL CARE IF:  Your medicine does not help your pain.  You have abnormal vaginal discharge. SEEK IMMEDIATE MEDICAL CARE IF:   You have heavy bleeding from the vagina.   Your pelvic pain increases.   You feel light-headed or faint.   You have chills.   You have pain with urination or blood in your urine.   You have uncontrolled diarrhea or vomiting.   You have a fever or persistent symptoms for more than 3 days.  You have a fever and your symptoms suddenly get worse.   You are being physically or sexually abused.  MAKE SURE YOU:  Understand these instructions.  Will watch your condition.  Will get help if you are not doing well or get worse. Document Released: 01/06/2004 Document Revised: 06/25/2013 Document Reviewed: 05/31/2011 [facility] Patient Information 2015 [place], [place]. This information is not intended to replace advice given to you by your health care provider. Make sure you discuss any questions you have with your health care provider. Will talk soon

## 2013-12-03 NOTE — Progress Notes (Signed)
Subjective:     Patient ID: Jaime Sanchez, female   DOB: [birth year], 43 y.o.   MRN: [id number]  HPI Jaime Sanchez" is a 43 year old white female in for [place] for pelvic pain for over a year in ovary area.Had not full period in a year but spots brown every so often and then several weeks ago had bright red bleeding.Also complains of weight and short of breath with exertion but is off RA meds.Is having hot flashes and night sweats.  Review of Systems See HPI Reviewed past medical,surgical, social and family history. Reviewed medications and allergies.     Objective:   Physical Exam BP 126/80  Ht 5\' 9"  (1.753 m)  Wt 371 lb 3.2 oz (168.375 kg)  BMI 54.79 kg/m2Discussed [place] with pt.   Uterus 7.5 x 5.3 x 4.8 cm, anteverted  Endometrium 8.4 mm, with 8.62mm ?fibroid? Noted within fundus distorting endometrial cavity  Right ovary 2.8 x 1.8 cm, 1.7cm  Left ovary 2.7 x 2.1 x 2.0 cm,  No free fluid or adnexal masses noted within the pelvis  Technician Comments:  Anteverted uterus, Endometrial cavity distorted within the fundal region by 8.32mm ?fibroid?, bilateral adnexa/ovaries appear WNL no free fluid or adnexal masses noted within the pelvis  Will check TSH and FSH and told her if [facility] elevated with get endo biopsy.   Assessment:     Pelvic pain Irregular menses Hot flashes Fibroids  Obesity     Plan:     Check FSH and TSH Review handout on contrave and also discussed sleeve surgery Review handout on pelvic pain and fibroid   Will talk when labs back

## 2013-12-04 ENCOUNTER — Telehealth: Payer: Self-pay | Admitting: Adult Health

## 2013-12-04 LAB — FOLLICLE STIMULATING HORMONE: FSH: 16 m[IU]/mL

## 2013-12-04 NOTE — Telephone Encounter (Signed)
Pt aware of labs will get endo biopsy to evaluate endometrium

## 2013-12-17 ENCOUNTER — Encounter: Payer: Self-pay | Admitting: Obstetrics and Gynecology

## 2013-12-17 ENCOUNTER — Other Ambulatory Visit: Payer: Self-pay | Admitting: Obstetrics and Gynecology

## 2013-12-17 ENCOUNTER — Ambulatory Visit (INDEPENDENT_AMBULATORY_CARE_PROVIDER_SITE_OTHER): Payer: 59 | Admitting: Obstetrics and Gynecology

## 2013-12-17 DIAGNOSIS — Z3202 Encounter for pregnancy test, result negative: Secondary | ICD-10-CM

## 2013-12-17 DIAGNOSIS — N840 Polyp of corpus uteri: Secondary | ICD-10-CM

## 2013-12-17 DIAGNOSIS — R9389 Abnormal findings on diagnostic imaging of other specified body structures: Secondary | ICD-10-CM

## 2013-12-17 DIAGNOSIS — Z3200 Encounter for pregnancy test, result unknown: Secondary | ICD-10-CM

## 2013-12-17 LAB — POCT URINE PREGNANCY: Preg Test, Ur: NEGATIVE

## 2013-12-17 NOTE — Progress Notes (Signed)
Patient ID: Jaime Sanchez, female   DOB: 1970-08-13, 43 y.o.   MRN: [id number]  She has not had a normal menses in over a year but she bled for 2 days last month.  She does not have a history of DM.  She is not allergic to any medication.  She is sexually active but does not use birth control. She is chronically anovulatory. Preg test is negative.    Endometrial Biopsy: Patient given informed consent, signed copy in the chart, time out was performed. Time out taken. The patient was placed in the lithotomy position and the cervix brought into view with sterile speculum.  Portio of cervix cleansed x 2 with betadine swabs.  A tenaculum was placed in the anterior lip of the cervix. The uterus was sounded for depth of 7 cm. Milex uterine Explora 3 mm was introduced to into the uterus, suction created,  and an endometrial sample was obtained. All equipment was removed and accounted for.   The patient tolerated the procedure well.   Patient given post procedure instructions.  Followup: By phone  This chart was scribed for Jonnie Kind, MD by Donato Schultz, ED Scribe. This patient was seen in Room 2 and the patient's care was started at 2:56 PM.

## 2013-12-17 NOTE — Patient Instructions (Signed)
followup by phone on results.

## 2013-12-18 ENCOUNTER — Telehealth: Payer: Self-pay | Admitting: Obstetrics and Gynecology

## 2013-12-18 NOTE — Telephone Encounter (Signed)
Left message

## 2013-12-19 ENCOUNTER — Telehealth: Payer: Self-pay | Admitting: Obstetrics and Gynecology

## 2013-12-20 ENCOUNTER — Telehealth: Payer: Self-pay | Admitting: Obstetrics and Gynecology

## 2013-12-24 ENCOUNTER — Encounter: Payer: Self-pay | Admitting: Obstetrics and Gynecology

## 2014-03-20 ENCOUNTER — Other Ambulatory Visit (INDEPENDENT_AMBULATORY_CARE_PROVIDER_SITE_OTHER): Payer: Self-pay

## 2014-03-27 ENCOUNTER — Other Ambulatory Visit (INDEPENDENT_AMBULATORY_CARE_PROVIDER_SITE_OTHER): Payer: Self-pay

## 2014-04-18 ENCOUNTER — Other Ambulatory Visit (HOSPITAL_COMMUNITY): Payer: Self-pay

## 2014-04-27 ENCOUNTER — Ambulatory Visit: Payer: Self-pay | Admitting: Dietician

## 2014-05-01 ENCOUNTER — Other Ambulatory Visit: Payer: Self-pay | Admitting: General Surgery

## 2014-05-01 ENCOUNTER — Other Ambulatory Visit (INDEPENDENT_AMBULATORY_CARE_PROVIDER_SITE_OTHER): Payer: Self-pay | Admitting: General Surgery

## 2014-05-01 ENCOUNTER — Ambulatory Visit (HOSPITAL_COMMUNITY)
Admission: RE | Admit: 2014-05-01 | Discharge: 2014-05-01 | Disposition: A | Discharge status: Home / Self Care | Payer: 59 | Source: Ambulatory Visit | Attending: General Surgery | Admitting: General Surgery

## 2014-05-01 DIAGNOSIS — N926 Irregular menstruation, unspecified: Secondary | ICD-10-CM

## 2014-05-01 LAB — PREGNANCY, URINE: Preg Test, Ur: NEGATIVE

## 2014-05-25 ENCOUNTER — Encounter: Payer: 59 | Attending: General Surgery | Admitting: Dietician

## 2014-05-25 ENCOUNTER — Encounter: Payer: Self-pay | Admitting: Dietician

## 2014-05-25 DIAGNOSIS — Z6841 Body Mass Index (BMI) 40.0 and over, adult: Secondary | ICD-10-CM | POA: Diagnosis not present

## 2014-05-25 DIAGNOSIS — Z713 Dietary counseling and surveillance: Secondary | ICD-10-CM | POA: Insufficient documentation

## 2014-05-25 NOTE — Patient Instructions (Signed)

## 2014-05-25 NOTE — Progress Notes (Signed)
  Pre-Op Assessment Visit:  Pre-Operative LAGB Surgery  Medical Nutrition Therapy:  Appt start time: 0932   End time:  6712.  Patient was seen on 05/25/2014 for Pre-Operative Nutrition Assessment. Assessment and letter of approval faxed to [facility]ariatric [facility] Program coordinator on 05/25/2014.   Preferred Learning Style:   No preference indicated   Learning Readiness:   Ready  Handouts given during visit include:  Pre-Op Goals Bariatric Surgery Protein Shakes   During the appointment today the following Pre-Op Goals were reviewed with the patient: Maintain or lose weight as instructed by your surgeon Make healthy food choices Begin to limit portion sizes Limited concentrated sugars and fried foods Keep fat/sugar in the single digits per serving on   food labels Practice CHEWING your food  (aim for 30 chews per bite or until applesauce consistency) Practice not drinking 15 minutes before, during, and 30 minutes after each meal/snack Avoid all carbonated beverages  Avoid/limit caffeinated beverages  Avoid all sugar-sweetened beverages Consume 3 meals per day; eat every 3-5 hours Make a list of non-food related activities Aim for 64-100 ounces of FLUID daily  Aim for at least 60-80 grams of PROTEIN daily Look for a liquid protein source that contain ?15 g protein and ?5 g carbohydrate  (ex: shakes, drinks, shots)  Patient-Centered Goals: She would like to be more active with her family.  7 level of confidence/10 level of importance  Demonstrated degree of understanding via:  Teach Back  Teaching Method Utilized:  Visual Auditory Hands on  Barriers to learning/adherence to lifestyle change: none  Patient to call the [facility] to enroll in Pre-Op and Post-Op Nutrition Education when surgery date is scheduled.

## 2014-07-02 ENCOUNTER — Other Ambulatory Visit: Payer: Self-pay | Admitting: Dermatology

## 2014-11-01 ENCOUNTER — Other Ambulatory Visit (HOSPITAL_COMMUNITY): Payer: Self-pay | Admitting: Pulmonary Disease

## 2014-11-01 ENCOUNTER — Ambulatory Visit (HOSPITAL_COMMUNITY)
Admission: RE | Admit: 2014-11-01 | Discharge: 2014-11-01 | Disposition: A | Discharge status: Home / Self Care | Payer: 59 | Source: Ambulatory Visit | Attending: Pulmonary Disease | Admitting: Pulmonary Disease

## 2014-11-01 DIAGNOSIS — M79672 Pain in left foot: Secondary | ICD-10-CM | POA: Insufficient documentation

## 2014-11-01 DIAGNOSIS — M7732 Calcaneal spur, left foot: Secondary | ICD-10-CM | POA: Insufficient documentation

## 2016-02-04 IMAGING — RF DG UGI W/ KUB
15 of 24 series · 15 of 24 positions shown · non-contrast
Comparison: None.

CLINICAL DATA: Bariatric screening.  Morbid obesity.

EXAM:
UPPER GI SERIES WITH KUB
TECHNIQUE: After obtaining a scout radiograph a routine upper GI series was
performed using thin barium
FLUOROSCOPY TIME:  Radiation Exposure Index (as provided by the
fluoroscopic device):
If the device does not provide the exposure index:
Fluoroscopy Time (in minutes and seconds):  2 minutes 42 seconds
Number of Acquired Images:  2

[Series 1: abdomen kub · 0.14mm/px · 1 of 1 slices shown]
[im 1/1]
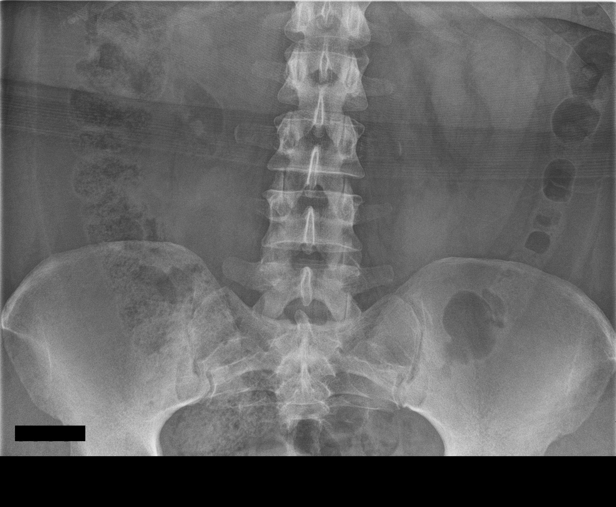

[Series 2: run · 1 of 1 slices shown (1 of 14)]
[im 1/1]
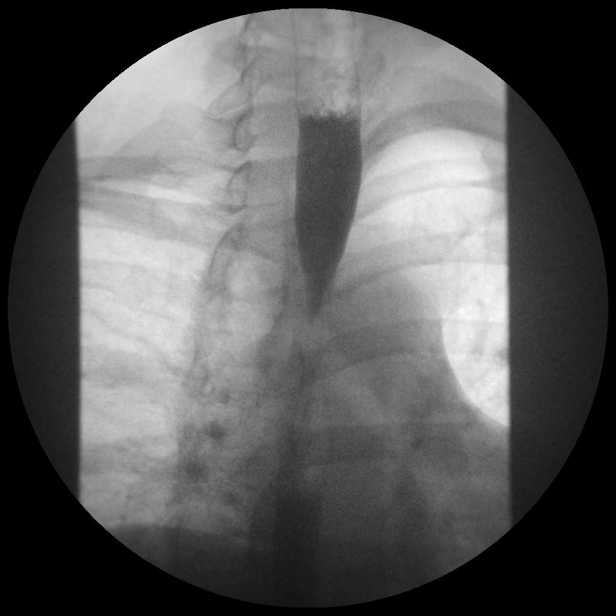

[Series 3: run · 1 of 1 slices shown (2 of 14)]
[im 1/1]
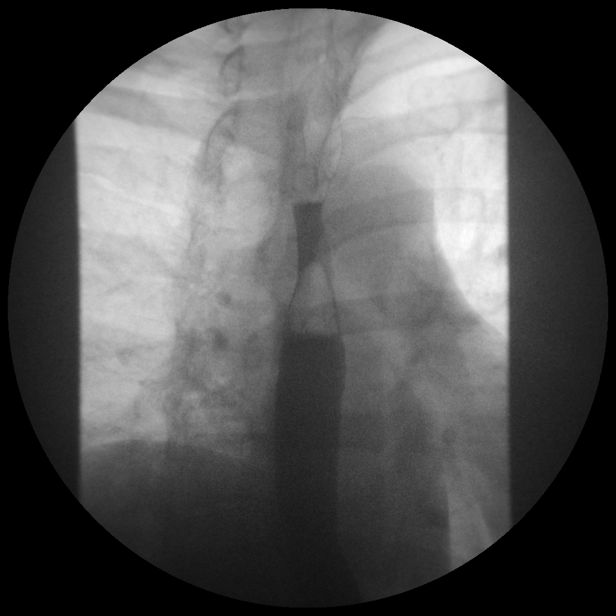

[Series 4: run · 1 of 1 slices shown (3 of 14)]
[im 1/1]
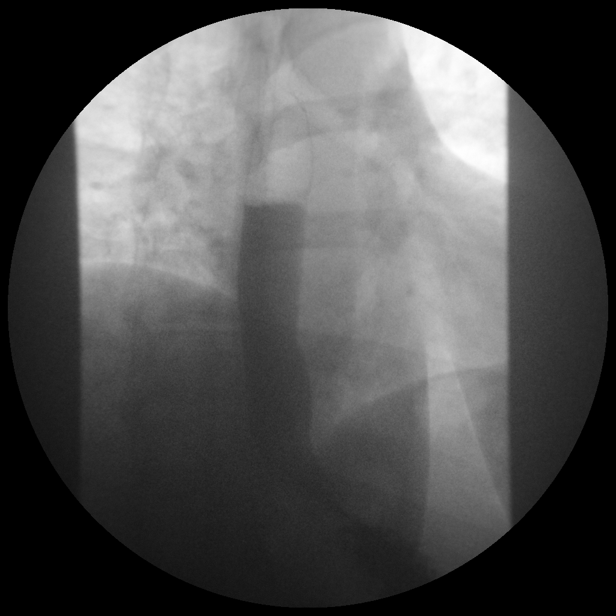

[Series 6: run · 1 of 1 slices shown (4 of 14)]
[im 1/1]
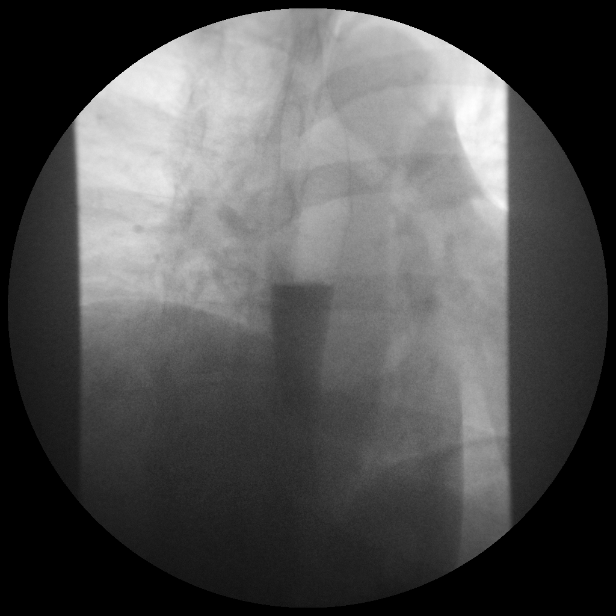

[Series 7: run · 1 of 1 slices shown (5 of 14)]
[im 1/1]
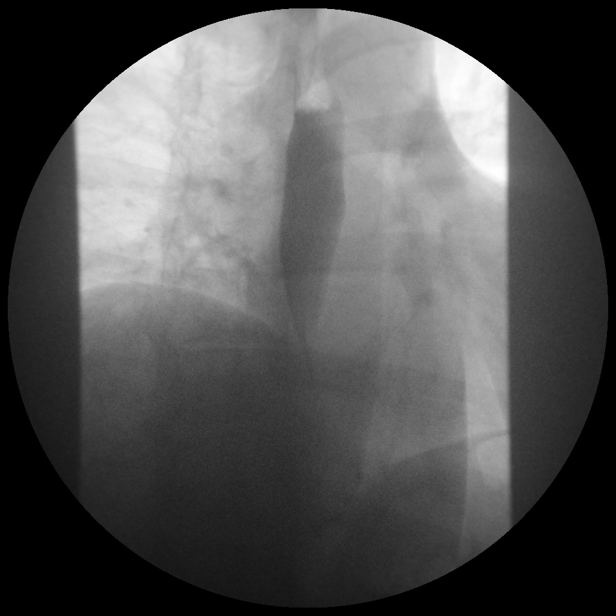

[Series 9: run · 1 of 1 slices shown (6 of 14)]
[im 1/1]
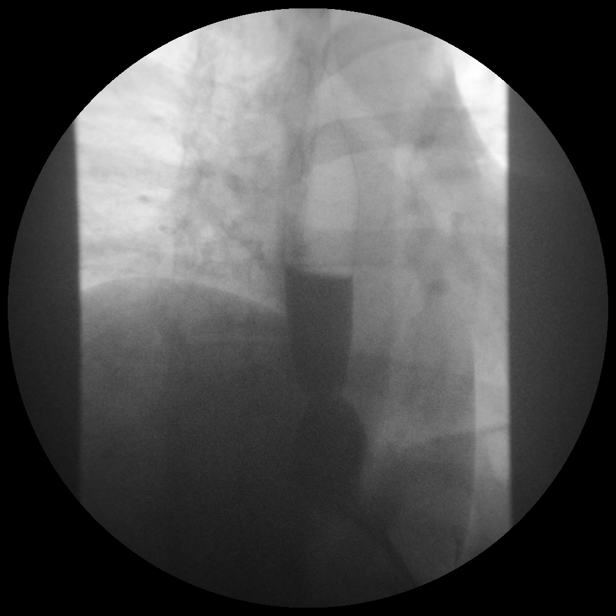

[Series 11: run · 1 of 1 slices shown (7 of 14)]
[im 1/1]
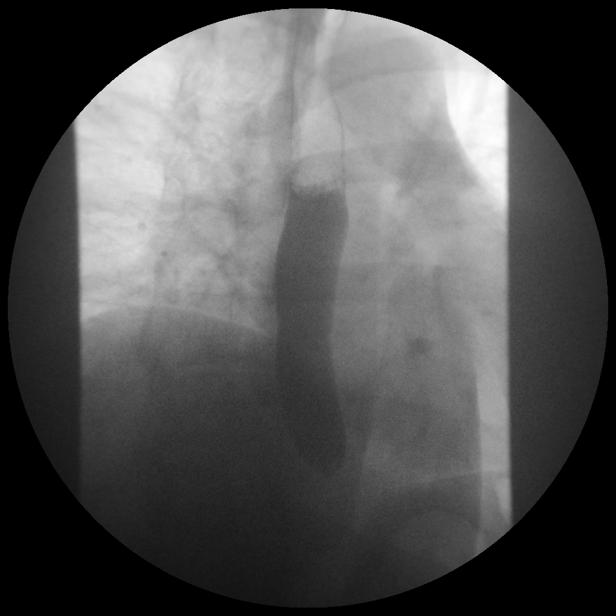

[Series 12: run · 1 of 1 slices shown (8 of 14)]
[im 1/1]
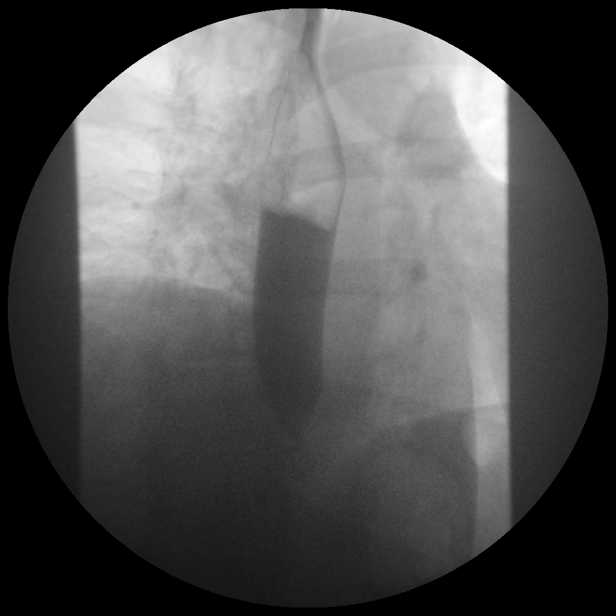

[Series 14: run · 1 of 1 slices shown (9 of 14)]
[im 1/1]
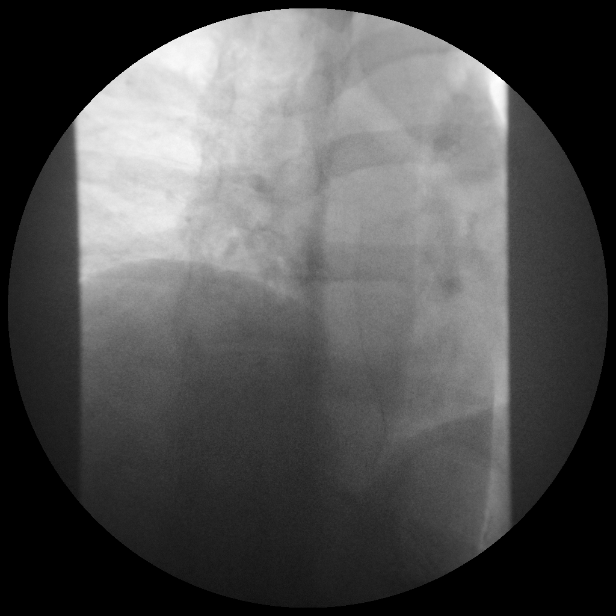

[Series 15: run · 1 of 1 slices shown (10 of 14)]
[im 1/1]
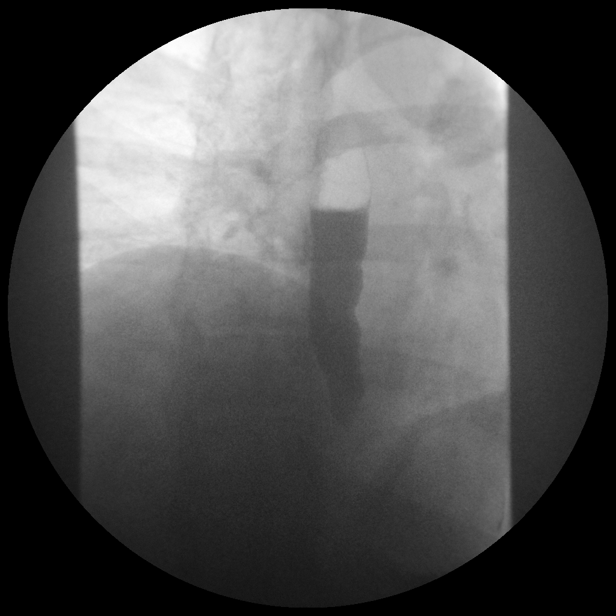

[Series 17: run · 1 of 1 slices shown (11 of 14)]
[im 1/1]
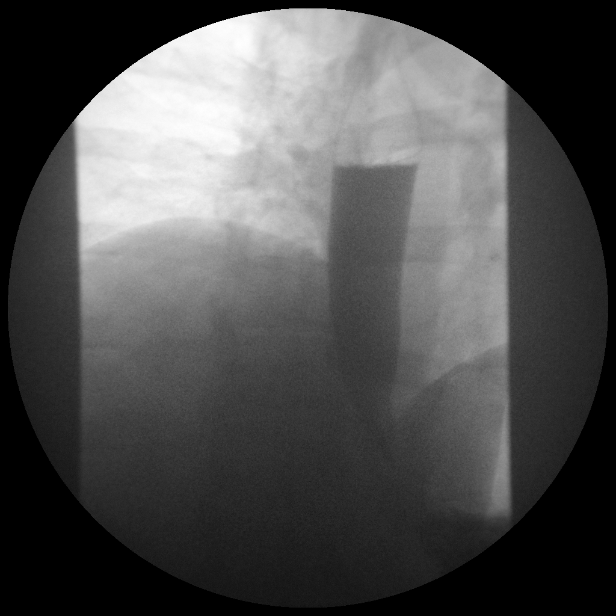

[Series 19: run · 1 of 1 slices shown (12 of 14)]
[im 1/1]
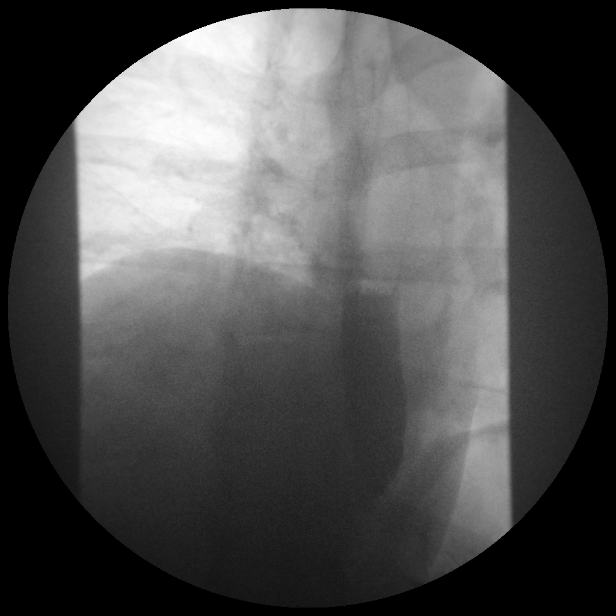

[Series 20: run · 1 of 1 slices shown (13 of 14)]
[im 1/1]
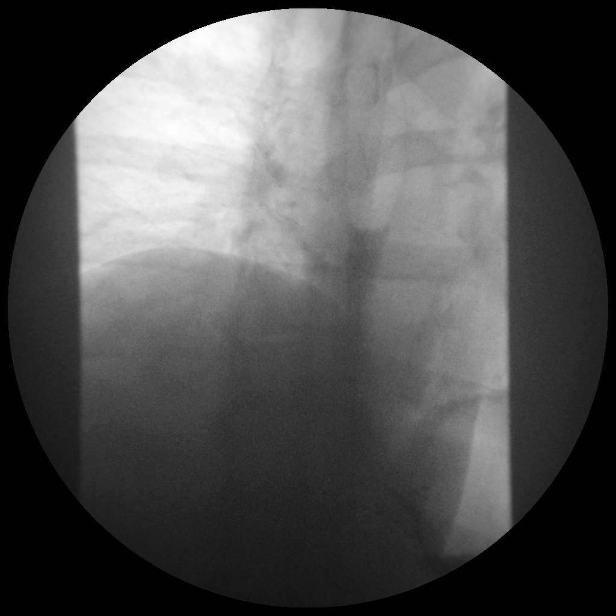

[Series 22: run · 1 of 1 slices shown (14 of 14)]
[im 1/1]
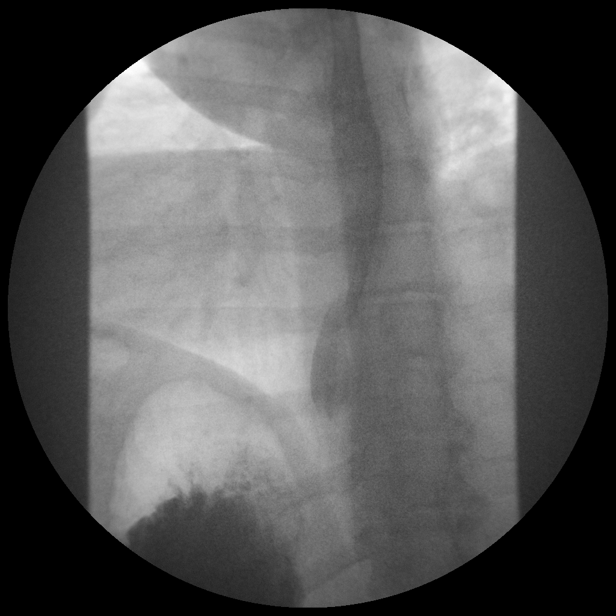

[15 of 24 positions shown; findings below may reference images not displayed]

FINDINGS: Scout film demonstrates normal bowel gas pattern with moderate stool
in the colon. Prior cholecystectomy. No organomegaly or suspicious
calcification.

Fluoroscopic evaluation of swallowing demonstrates normal esophageal
motility. No fixed stricture, fold thickening or mass. No reflux
with the water siphon maneuver.

Stomach, duodenal bulb and duodenal sweep are normal. No ulceration,
fold thickening or mass.
IMPRESSION: Unremarkable study.

## 2016-03-18 DIAGNOSIS — M1990 Unspecified osteoarthritis, unspecified site: Secondary | ICD-10-CM | POA: Diagnosis not present

## 2016-03-18 DIAGNOSIS — I10 Essential (primary) hypertension: Secondary | ICD-10-CM | POA: Diagnosis not present

## 2016-03-18 DIAGNOSIS — R0789 Other chest pain: Secondary | ICD-10-CM | POA: Diagnosis not present

## 2016-03-19 DIAGNOSIS — R739 Hyperglycemia, unspecified: Secondary | ICD-10-CM | POA: Diagnosis not present

## 2016-03-19 DIAGNOSIS — I10 Essential (primary) hypertension: Secondary | ICD-10-CM | POA: Diagnosis not present

## 2016-03-19 DIAGNOSIS — M1990 Unspecified osteoarthritis, unspecified site: Secondary | ICD-10-CM | POA: Diagnosis not present

## 2016-04-13 DIAGNOSIS — Z0000 Encounter for general adult medical examination without abnormal findings: Secondary | ICD-10-CM | POA: Diagnosis not present

## 2016-10-07 DIAGNOSIS — J069 Acute upper respiratory infection, unspecified: Secondary | ICD-10-CM | POA: Diagnosis not present

## 2016-10-07 DIAGNOSIS — J00 Acute nasopharyngitis [common cold]: Secondary | ICD-10-CM | POA: Diagnosis not present

## 2016-10-07 DIAGNOSIS — R05 Cough: Secondary | ICD-10-CM | POA: Diagnosis not present

## 2017-01-26 DIAGNOSIS — L918 Other hypertrophic disorders of the skin: Secondary | ICD-10-CM | POA: Diagnosis not present

## 2017-01-26 DIAGNOSIS — D229 Melanocytic nevi, unspecified: Secondary | ICD-10-CM | POA: Diagnosis not present

## 2017-01-26 DIAGNOSIS — L821 Other seborrheic keratosis: Secondary | ICD-10-CM | POA: Diagnosis not present

## 2017-02-24 ENCOUNTER — Other Ambulatory Visit: Payer: Self-pay | Admitting: Dermatology

## 2017-02-24 DIAGNOSIS — D492 Neoplasm of unspecified behavior of bone, soft tissue, and skin: Secondary | ICD-10-CM | POA: Diagnosis not present

## 2017-05-10 DIAGNOSIS — L2489 Irritant contact dermatitis due to other agents: Secondary | ICD-10-CM | POA: Diagnosis not present

## 2017-05-12 DIAGNOSIS — I10 Essential (primary) hypertension: Secondary | ICD-10-CM | POA: Diagnosis not present

## 2017-05-12 DIAGNOSIS — L0390 Cellulitis, unspecified: Secondary | ICD-10-CM | POA: Diagnosis not present

## 2017-05-16 DIAGNOSIS — I10 Essential (primary) hypertension: Secondary | ICD-10-CM | POA: Diagnosis not present

## 2017-05-16 DIAGNOSIS — L0390 Cellulitis, unspecified: Secondary | ICD-10-CM | POA: Diagnosis not present

## 2017-07-29 DIAGNOSIS — I10 Essential (primary) hypertension: Secondary | ICD-10-CM | POA: Diagnosis not present

## 2017-07-29 DIAGNOSIS — G4700 Insomnia, unspecified: Secondary | ICD-10-CM | POA: Diagnosis not present

## 2017-08-05 DIAGNOSIS — L0390 Cellulitis, unspecified: Secondary | ICD-10-CM | POA: Diagnosis not present

## 2017-08-08 DIAGNOSIS — L0390 Cellulitis, unspecified: Secondary | ICD-10-CM | POA: Diagnosis not present

## 2017-08-08 DIAGNOSIS — I10 Essential (primary) hypertension: Secondary | ICD-10-CM | POA: Diagnosis not present

## 2017-09-29 DIAGNOSIS — L03311 Cellulitis of abdominal wall: Secondary | ICD-10-CM | POA: Diagnosis not present

## 2017-10-04 ENCOUNTER — Other Ambulatory Visit: Payer: Self-pay | Admitting: Dermatology

## 2017-10-04 DIAGNOSIS — T7840XA Allergy, unspecified, initial encounter: Secondary | ICD-10-CM | POA: Diagnosis not present

## 2017-10-04 DIAGNOSIS — L0390 Cellulitis, unspecified: Secondary | ICD-10-CM | POA: Diagnosis not present

## 2017-10-04 DIAGNOSIS — L309 Dermatitis, unspecified: Secondary | ICD-10-CM | POA: Diagnosis not present

## 2017-10-04 DIAGNOSIS — D485 Neoplasm of uncertain behavior of skin: Secondary | ICD-10-CM | POA: Diagnosis not present

## 2017-10-04 DIAGNOSIS — L983 Eosinophilic cellulitis [Wells]: Secondary | ICD-10-CM | POA: Diagnosis not present

## 2017-10-10 ENCOUNTER — Encounter: Payer: Self-pay | Admitting: Adult Health

## 2017-10-27 DIAGNOSIS — L309 Dermatitis, unspecified: Secondary | ICD-10-CM | POA: Diagnosis not present

## 2017-10-31 DIAGNOSIS — Z5181 Encounter for therapeutic drug level monitoring: Secondary | ICD-10-CM | POA: Diagnosis not present

## 2018-08-10 ENCOUNTER — Telehealth: Payer: Self-pay

## 2018-08-10 DIAGNOSIS — Z20822 Contact with and (suspected) exposure to covid-19: Secondary | ICD-10-CM

## 2018-08-10 NOTE — Telephone Encounter (Signed)
Call received from Dr Sinda Du office. Pt is to be scheduled for COVID-19 testing.  Office [phone] Fax [phone]  Pt contact and appointment scheduled. Order placed.

## 2018-08-11 ENCOUNTER — Other Ambulatory Visit: Payer: 59

## 2018-08-11 DIAGNOSIS — Z20822 Contact with and (suspected) exposure to covid-19: Secondary | ICD-10-CM

## 2018-08-13 LAB — NOVEL CORONAVIRUS, NAA: SARS-CoV-2, NAA: NOT DETECTED

## 2022-03-01 ENCOUNTER — Other Ambulatory Visit (HOSPITAL_COMMUNITY): Payer: Self-pay | Admitting: Family Medicine

## 2022-03-01 DIAGNOSIS — Z1231 Encounter for screening mammogram for malignant neoplasm of breast: Secondary | ICD-10-CM

## 2022-03-08 ENCOUNTER — Ambulatory Visit (HOSPITAL_COMMUNITY)
Admission: RE | Admit: 2022-03-08 | Discharge: 2022-03-08 | Disposition: A | Discharge status: Home / Self Care | Payer: BC Managed Care – PPO | Source: Ambulatory Visit | Attending: Family Medicine | Admitting: Family Medicine

## 2022-03-08 DIAGNOSIS — Z1231 Encounter for screening mammogram for malignant neoplasm of breast: Secondary | ICD-10-CM | POA: Insufficient documentation

## 2022-05-18 ENCOUNTER — Encounter: Payer: Self-pay | Admitting: Adult Health

## 2022-05-18 ENCOUNTER — Other Ambulatory Visit (HOSPITAL_COMMUNITY)
Admission: RE | Admit: 2022-05-18 | Discharge: 2022-05-18 | Disposition: A | Discharge status: Home / Self Care | Payer: BC Managed Care – PPO | Source: Ambulatory Visit | Attending: Adult Health | Admitting: Adult Health

## 2022-05-18 ENCOUNTER — Ambulatory Visit: Payer: BC Managed Care – PPO | Admitting: Adult Health

## 2022-05-18 VITALS — BP 147/94 | HR 65 | Ht 70.0 in | Wt 349.0 lb

## 2022-05-18 DIAGNOSIS — Z1212 Encounter for screening for malignant neoplasm of rectum: Secondary | ICD-10-CM | POA: Diagnosis not present

## 2022-05-18 DIAGNOSIS — Z1211 Encounter for screening for malignant neoplasm of colon: Secondary | ICD-10-CM

## 2022-05-18 DIAGNOSIS — N950 Postmenopausal bleeding: Secondary | ICD-10-CM | POA: Diagnosis not present

## 2022-05-18 DIAGNOSIS — Z01419 Encounter for gynecological examination (general) (routine) without abnormal findings: Secondary | ICD-10-CM | POA: Diagnosis present

## 2022-05-18 DIAGNOSIS — Z13220 Encounter for screening for lipoid disorders: Secondary | ICD-10-CM

## 2022-05-18 DIAGNOSIS — Z131 Encounter for screening for diabetes mellitus: Secondary | ICD-10-CM

## 2022-05-18 DIAGNOSIS — R5383 Other fatigue: Secondary | ICD-10-CM

## 2022-05-18 LAB — HEMOCCULT GUIAC POC 1CARD (OFFICE): Fecal Occult Blood, POC: NEGATIVE

## 2022-05-18 NOTE — Progress Notes (Signed)
Patient ID: Jaime Sanchez, female   DOB: 18-Apr-1970, 52 y.o.   MRN: [id number] History of Present Illness: Jaime Sanchez is a 52 year old white female,married, PM in for well woman Gyn exam and pap, last pap was 2014, and she is wiping blood for over 6 months, has been 2 years since last period.  PCP is Orpah Melter MD.    Current Medications, Allergies, Past Medical History, Past Surgical History, Family History and Social History were reviewed in [employer] record.     Review of Systems: Patient denies any headaches, hearing loss,  blurred vision, shortness of breath, chest pain, abdominal pain, problems with bowel movements, urination, or intercourse. No joint pain or mood swings. +tired  See HPI for positives.    Physical Exam:BP (!) 147/94 (BP Location: Right Arm, Patient Position: Sitting, Cuff Size: Large)   Pulse 65   Ht 5\' 10"  (1.778 m)   Wt (!) 349 lb (158.3 kg)   LMP 06/01/2014 Comment: Pt adamently denies any chance of pregnancy, abd shielded  BMI 50.08 kg/m   General:  Well developed, well nourished, no acute distress Skin:  Warm and dry Neck:  Midline trachea, normal thyroid, good ROM, no lymphadenopathy Lungs; Clear to auscultation bilaterally Breast:  No dominant palpable mass, retraction, or nipple discharge Cardiovascular: Regular rate and rhythm Abdomen:  Soft, non tender, no hepatosplenomegaly Pelvic:  External genitalia is normal in appearance, no lesions.  The vagina is normal in appearance. Urethra has no lesions or masses. The cervix is bulbous.Pap with GC/CHL performed. Uterus is felt to be normal size, shape, and contour.  No adnexal masses or tenderness noted.Bladder is non tender, no masses felt. Rectal: Good sphincter tone, no polyps, or hemorrhoids felt.  Hemoccult negative. Extremities/musculoskeletal:  No swelling or varicosities noted, no clubbing or cyanosis Psych:  No mood changes, alert and cooperative,seems happy AA is  1 Fall risk is low    05/18/2022   10:04 AM 05/25/2014    1:24 PM  Depression screen PHQ 2/9  Decreased Interest 1 0  Down, Depressed, Hopeless 1 0  PHQ - 2 Score 2 0  Altered sleeping 2   Tired, decreased energy 3   Change in appetite 0   Feeling bad or failure about yourself  0   Trouble concentrating 2   Moving slowly or fidgety/restless 0   Suicidal thoughts 0   PHQ-9 Score 9        05/18/2022   10:04 AM  GAD 7 : Generalized Anxiety Score  Nervous, Anxious, on Edge 0  Control/stop worrying 0  Worry too much - different things 0  Trouble relaxing 1  Restless 0  Easily annoyed or irritable 1  Afraid - awful might happen 0  Total GAD 7 Score 2      Upstream - 05/18/22 1017       Pregnancy Intention Screening   Does the patient want to become pregnant in the next year? No    Does the patient's partner want to become pregnant in the next year? No    Would the patient like to discuss contraceptive options today? No      Contraception Wrap Up   Current Method No Method - Other Reason   PM   Reason for No Current Contraceptive Method at Intake ([facility] Only) Other    End Method No Method - Other Reason   PM   Contraception Counseling Provided No  Examination chaperoned by Levy Pupa LPN  Impression and Plan: 1. Encounter for gynecological examination with Papanicolaou smear of cervix Pap sent Pap in 3 years if normal Physical in 1 year Had negative mammogram 03/08/22 Will check labs fasting, orders given  - Cytology - PAP( [facility]) - CBC - Comprehensive metabolic panel - Lipid panel - TSH + free T4  2. Encounter for screening fecal occult blood testing Hemoccult was negative   3. PMB (postmenopausal bleeding) Will get pelvic US to assess uterine lining, and she is aware if thickened will need endometrial biopsy She had one 2015 +polyp - US PELVIC COMPLETE WITH TRANSVAGINAL; Future  4. Screening for colorectal cancer Order sent for  cologuard  - Cologuard  5. Tired - CBC - TSH + free T4  6. Screening cholesterol level - Lipid panel  7. Screening for diabetes mellitus - Hemoglobin A1c

## 2022-05-19 LAB — CYTOLOGY - PAP
Comment: NEGATIVE
Diagnosis: NEGATIVE
High risk HPV: NEGATIVE

## 2022-05-20 ENCOUNTER — Telehealth: Payer: Self-pay | Admitting: *Deleted

## 2022-05-20 NOTE — Telephone Encounter (Signed)
-----   Message from Estill Dooms, NP sent at 05/19/2022  4:54 PM EDT ----- Let her know pap was negative for malignancy and HPV THX Delsa Sale

## 2022-05-20 NOTE — Telephone Encounter (Signed)
Left message @ 11:21 am, letting pt know pap was negative for malignancy & HPV. JSY

## 2022-06-01 ENCOUNTER — Ambulatory Visit (INDEPENDENT_AMBULATORY_CARE_PROVIDER_SITE_OTHER): Payer: BC Managed Care – PPO

## 2022-06-01 DIAGNOSIS — N950 Postmenopausal bleeding: Secondary | ICD-10-CM

## 2022-06-01 NOTE — Progress Notes (Signed)
PELVIC US TA/TV: homogeneous anteverted uterus,WNL,thickened endometrium,EEC 13 mm,submucosal fibroid with color flow 2.1 x 1.7 x 1.9 cm,normal ovaries,ovaries appear mobile,right adnexal pain during ultrasound,no free fluid  Chaperone [facility]

## 2022-06-02 ENCOUNTER — Telehealth: Payer: Self-pay | Admitting: Adult Health

## 2022-06-02 NOTE — Telephone Encounter (Signed)
Pt aware that endometrial lining is thickened will get appt with Dr Charlotta Newton for endometrial biopsy and has small fibroid

## 2022-06-11 ENCOUNTER — Ambulatory Visit (INDEPENDENT_AMBULATORY_CARE_PROVIDER_SITE_OTHER): Payer: BC Managed Care – PPO | Admitting: Obstetrics & Gynecology

## 2022-06-11 ENCOUNTER — Other Ambulatory Visit (HOSPITAL_COMMUNITY)
Admission: RE | Admit: 2022-06-11 | Discharge: 2022-06-11 | Disposition: A | Discharge status: Home / Self Care | Payer: BC Managed Care – PPO | Source: Ambulatory Visit | Attending: Obstetrics & Gynecology | Admitting: Obstetrics & Gynecology

## 2022-06-11 ENCOUNTER — Encounter: Payer: Self-pay | Admitting: Obstetrics & Gynecology

## 2022-06-11 VITALS — BP 137/74 | HR 67 | Ht 70.0 in | Wt 348.0 lb

## 2022-06-11 DIAGNOSIS — N950 Postmenopausal bleeding: Secondary | ICD-10-CM

## 2022-06-11 DIAGNOSIS — N8501 Benign endometrial hyperplasia: Secondary | ICD-10-CM | POA: Diagnosis not present

## 2022-06-11 DIAGNOSIS — R9389 Abnormal findings on diagnostic imaging of other specified body structures: Secondary | ICD-10-CM | POA: Insufficient documentation

## 2022-06-11 NOTE — Progress Notes (Addendum)
GYN VISIT Patient name: Jaime Sanchez MRN [id number]  Date of birth: [birth year] Chief Complaint:   Procedure  History of Present Illness:   Jaime Sanchez is a 52 y.o. [phone] female being seen today for the following concerns:  -PMB: Since August 2023- notes on/off spotting when wipes- sometimes pink to brown to red.  Occasional stringy to small clots.  Nothing requiring a pad.  Notes occasional pelvic pain- again intermittent.  Occasional nausea.  No constipation/diarrhea.  No other acute complaints  Seen by J.Griffin.  Pelvic ultrasound ordered 05/2022: 6 x 4 x 6 cm uterus, 13 mm endometrium with submucosal fibroid with color flow 2.1 x 1.7 x 1.9 cm.  Normal ovaries bilaterally  +Urgency     Patient's last menstrual period was 06/01/2014.     05/18/2022   10:04 AM 05/25/2014    1:24 PM  Depression screen PHQ 2/9  Decreased Interest 1 0  Down, Depressed, Hopeless 1 0  PHQ - 2 Score 2 0  Altered sleeping 2   Tired, decreased energy 3   Change in appetite 0   Feeling bad or failure about yourself  0   Trouble concentrating 2   Moving slowly or fidgety/restless 0   Suicidal thoughts 0   PHQ-9 Score 9      Review of Systems:   Pertinent items are noted in HPI Denies fever/chills, dizziness, headaches, visual disturbances, fatigue, shortness of breath, chest pain, abdominal pain, vomiting, no problems with bowel movements, urination, or intercourse unless otherwise stated above.  Pertinent History Reviewed:  Reviewed past medical,surgical, social, obstetrical and family history.  Reviewed problem list, medications and allergies. Physical Assessment:   Vitals:   06/11/22 1006  BP: 137/74  Pulse: 67  Weight: (!) 348 lb (157.9 kg)  Height:  (1.778 m)  Body mass index is 49.93 kg/m.       Physical Examination:   General appearance: alert, well appearing, and in no distress  Psych: mood appropriate, normal affect  Skin: warm & dry   Cardiovascular:  normal heart rate noted  Respiratory: normal respiratory effort, no distress  Abdomen: obese, soft, non-tender-no reproducible pain  Pelvic: VULVA: normal appearing vulva with no masses, tenderness or lesions, VAGINA: normal appearing vagina with normal color and discharge, no lesions, CERVIX: normal appearing cervix without discharge or lesions.  Bimanual exam limited due to body habitus  Extremities: no edema   Chaperone: Faith Rogue    Endometrial Biopsy Procedure Note  Pre-operative Diagnosis: Postmenopausal bleeding, thickened endometrium  Post-operative Diagnosis: same  Procedure Details  The risks (including infection, bleeding, pain, and uterine perforation) and benefits of the procedure were explained to the patient and Written informed consent was obtained.  Antibiotic prophylaxis against endocarditis was not indicated.   The patient was placed in the dorsal lithotomy position.  Bimanual exam showed the uterus to be in the neutral position.  A speculum inserted in the vagina, and the cervix prepped with betadine.     A single tooth tenaculum was applied to the anterior lip of the cervix for stabilization.  A Pipelle endometrial aspirator was used to sample the endometrium.  Sample was sent for pathologic examination.  Condition: Stable  Complications: None   Assessment & Plan:  1) postmenopausal bleeding, thickened endometrium -Reviewed potential etiologies, discussed workup including results of ultrasound, and plan for endometrial biopsy today -Next step pending results of pathology -The patient was advised to call for any fever or for prolonged or severe  pain or bleeding. She was advised to use OTC analgesics as needed for mild to moderate pain. She was advised to avoid vaginal intercourse for 48 hours or until the bleeding has completely stopped.  Discussed potential results and management plans including progesterone therapy and/or potential referral pending  results  No orders of the defined types were placed in this encounter.   Return for TBD.   Myna Hidalgo, DO Attending Obstetrician & Gynecologist, [facility] for [employer], [facility]

## 2022-06-14 LAB — SURGICAL PATHOLOGY

## 2022-06-15 MED ORDER — MEDROXYPROGESTERONE ACETATE 10 MG PO TABS: 10.0000 mg | ORAL_TABLET | Freq: Every day | ORAL | 4 refills | Status: AC

## 2022-06-15 NOTE — Addendum Note (Signed)
Addended by: Sharon Seller on: 06/15/2022 11:26 AM   Modules accepted: Orders

## 2022-06-16 ENCOUNTER — Encounter: Payer: Self-pay | Admitting: Obstetrics & Gynecology

## 2022-11-16 NOTE — Progress Notes (Unsigned)
Reviewed chart

## 2022-11-22 ENCOUNTER — Ambulatory Visit: Payer: BC Managed Care – PPO | Admitting: Internal Medicine

## 2022-11-23 ENCOUNTER — Encounter: Payer: BC Managed Care – PPO | Admitting: Internal Medicine

## 2022-11-23 NOTE — Progress Notes (Signed)
Erroneous encounter - please disregard.

## 2024-03-28 ENCOUNTER — Encounter: Payer: Self-pay | Admitting: Adult Health
